# Patient Record
Sex: Male | Born: 1985 | Race: Black or African American | Hispanic: No | Marital: Single | Smoking: Current some day smoker
Health system: Southern US, Community
[De-identification: ages and names within clinical notes are randomized; demographics above are authoritative.]

## PROBLEM LIST (undated history)

## (undated) DIAGNOSIS — D573 Sickle-cell trait: Secondary | ICD-10-CM

## (undated) DIAGNOSIS — Z789 Other specified health status: Secondary | ICD-10-CM

## (undated) HISTORY — DX: Other specified health status: Z78.9

## (undated) HISTORY — DX: Sickle-cell trait: D57.3

---
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
## 2005-05-16 ENCOUNTER — Emergency Department (HOSPITAL_COMMUNITY): Admission: EM | Admit: 2005-05-16 | Discharge: 2005-05-16 | Payer: Self-pay | Admitting: Emergency Medicine

## 2013-09-19 ENCOUNTER — Emergency Department (HOSPITAL_COMMUNITY)
Admission: EM | Admit: 2013-09-19 | Discharge: 2013-09-19 | Disposition: A | Discharge status: Home / Self Care | Payer: Self-pay | Attending: Emergency Medicine | Admitting: Emergency Medicine

## 2013-09-19 ENCOUNTER — Encounter (HOSPITAL_COMMUNITY): Payer: Self-pay | Admitting: Emergency Medicine

## 2013-09-19 DIAGNOSIS — A638 Other specified predominantly sexually transmitted diseases: Secondary | ICD-10-CM | POA: Insufficient documentation

## 2013-09-19 DIAGNOSIS — R369 Urethral discharge, unspecified: Secondary | ICD-10-CM | POA: Insufficient documentation

## 2013-09-19 DIAGNOSIS — R300 Dysuria: Secondary | ICD-10-CM | POA: Insufficient documentation

## 2013-09-19 DIAGNOSIS — A64 Unspecified sexually transmitted disease: Secondary | ICD-10-CM

## 2013-09-19 LAB — URINALYSIS, ROUTINE W REFLEX MICROSCOPIC
Bilirubin Urine: NEGATIVE
Glucose, UA: NEGATIVE mg/dL
Hgb urine dipstick: NEGATIVE
Ketones, ur: NEGATIVE mg/dL
Leukocytes, UA: NEGATIVE
Nitrite: NEGATIVE
Protein, ur: NEGATIVE mg/dL
Specific Gravity, Urine: 1.021 (ref 1.005–1.030)
Urobilinogen, UA: 1 mg/dL (ref 0.0–1.0)
pH: 7 (ref 5.0–8.0)

## 2013-09-19 MED ORDER — AZITHROMYCIN 250 MG PO TABS
1000.0000 mg | ORAL_TABLET | Freq: Once | ORAL | Status: AC
  Administered 2013-09-19: 1000 mg via ORAL
  Filled 2013-09-19: qty 4

## 2013-09-19 MED ORDER — CEFTRIAXONE SODIUM 250 MG IJ SOLR
250.0000 mg | Freq: Once | INTRAMUSCULAR | Status: AC
  Administered 2013-09-19: 250 mg via INTRAMUSCULAR
  Filled 2013-09-19: qty 250

## 2013-09-19 MED ORDER — LIDOCAINE HCL (PF) 1 % IJ SOLN
INTRAMUSCULAR | Status: AC
  Filled 2013-09-19: qty 5

## 2013-09-19 NOTE — ED Provider Notes (Signed)
Medical screening examination/treatment/procedure(s) were performed by non-physician practitioner and as supervising physician I was immediately available for consultation/collaboration.   EKG Interpretation None      Shaye Lagace, MD, FACEP   Ryanna Teschner L Jerriah Ines, MD 09/19/13 1618 

## 2013-09-19 NOTE — ED Notes (Signed)
Pt reports dysuria and penile discharge since yesterday. He thinks he may have an std

## 2013-09-19 NOTE — Discharge Instructions (Signed)
Sexually Transmitted Disease A sexually transmitted disease (STD) is a disease or infection that may be passed (transmitted) from person to person, usually during sexual activity. This may happen by way of saliva, semen, blood, vaginal mucus, or urine. Common STDs include:   Gonorrhea.   Chlamydia.   Syphilis.   HIV and AIDS.   Genital herpes.   Hepatitis B and C.   Trichomonas.   Human papillomavirus (HPV).   Pubic lice.   Scabies.  Mites.  Bacterial vaginosis. WHAT ARE CAUSES OF STDs? An STD may be caused by bacteria, a virus, or parasites. STDs are often transmitted during sexual activity if one person is infected. However, they may also be transmitted through nonsexual means. STDs may be transmitted after:   Sexual intercourse with an infected person.   Sharing sex toys with an infected person.   Sharing needles with an infected person or using unclean piercing or tattoo needles.  Having intimate contact with the genitals, mouth, or rectal areas of an infected person.   Exposure to infected fluids during birth. WHAT ARE THE SIGNS AND SYMPTOMS OF STDs? Different STDs have different symptoms. Some people may not have any symptoms. If symptoms are present, they may include:   Painful or bloody urination.   Pain in the pelvis, abdomen, vagina, anus, throat, or eyes.   Skin rash, itching, irritation, growths, sores (lesions), ulcerations, or warts in the genital or anal area.  Abnormal vaginal discharge with or without bad odor.   Penile discharge in men.   Fever.   Pain or bleeding during sexual intercourse.   Swollen glands in the groin area.   Yellow skin and eyes (jaundice). This is seen with hepatitis.   Swollen testicles.  Infertility.  Sores and blisters in the mouth. HOW ARE STDs DIAGNOSED? To make a diagnosis, your health care provider may:   Take a medical history.   Perform a physical exam.   Take a sample of any  discharge for examination.  Swab the throat, cervix, opening to the penis, rectum, or vagina for testing.  Test a sample of your first morning urine.   Perform blood tests.   Perform a Pap smear, if this applies.   Perform a colposcopy.   Perform a laparoscopy.  HOW ARE STDs TREATED? Treatment depends on the STD. Some STDs may be treated but not cured.   Chlamydia, gonorrhea, trichomonas, and syphilis can be cured with antibiotics.   Genital herpes, hepatitis, and HIV can be treated, but not cured, with prescribed medicines. The medicines lessen symptoms.   Genital warts from HPV can be treated with medicine or by freezing, burning (electrocautery), or surgery. Warts may come back.   HPV cannot be cured with medicine or surgery. However, abnormal areas may be removed from the cervix, vagina, or vulva.   If your diagnosis is confirmed, your recent sexual partners need treatment. This is true even if they are symptom-free or have a negative culture or evaluation. They should not have sex until their health care providers say it is OK. HOW CAN I REDUCE MY RISK OF GETTING AN STD?  Use latex condoms, dental dams, and water-soluble lubricants during sexual activity. Do not use petroleum jelly or oils.  Get vaccinated for HPV and hepatitis. If you have not received these vaccines in the past, talk to your health care provider about whether one or both might be right for you.   Avoid risky sex practices that can break the skin.  WHAT SHOULD   I DO IF I THINK I HAVE AN STD?  See your health care provider.   Inform all sexual partners. They should be tested and treated for any STDs.  Do not have sex until your health care provider says it is OK. WHEN SHOULD I GET HELP? Seek immediate medical care if:  You develop severe abdominal pain.  You are a man and notice swelling or pain in the testicles.  You are a woman and notice swelling or pain in your vagina. Document  Released: 09/23/2002 Document Revised: 04/23/2013 Document Reviewed: 01/21/2013 [employer] Patient Information 2014 [employer].  

## 2013-09-19 NOTE — ED Provider Notes (Signed)
CSN: [id number]     Arrival date & time 09/19/13  1430 History  This chart was scribed for non-physician practitioner Roxy Horsemanobert Daltyn Degroat, PA-C working with Ward GivensIva L Knapp, MD by Valera CastleSteven Perry, ED scribe. This patient was seen in room TR09C/TR09C and the patient's care was started at 3:25 PM.   Chief Complaint  Patient presents with  . SEXUALLY TRANSMITTED DISEASE   (Consider location/radiation/quality/duration/timing/severity/associated sxs/prior Treatment) The history is provided by the patient. No language interpreter was used.   Lawrence Hammond is a 28 y.o. male who presents to the Emergency Department with chief complaint of penile discharge and dysuria, onset 2 days ago. He reports having intercourse with a new partner. He has not tried anything to relieve his symptoms. There are no aggravating or relieving factors. He denies fever, chills, nausea, vomiting, and any other associated symptoms.  PCP - No PCP Per Patient  History reviewed. No pertinent past medical history. History reviewed. No pertinent past surgical history. History reviewed. No pertinent family history. History  Substance Use Topics  . Smoking status: Never Smoker   . Smokeless tobacco: Not on file  . Alcohol Use: Yes    Review of Systems  Constitutional: Negative for fever.  Genitourinary: Positive for dysuria and discharge.   Allergies  Review of patient's allergies indicates no known allergies.  Home Medications  No current outpatient prescriptions on file.  BP 141/70  Pulse 90  Temp(Src) 98.6 F (37 C) (Oral)  Resp 16  Ht 6\' 2"  (1.88 m)  Wt 173 lb 1.6 oz (78.518 kg)  BMI 22.22 kg/m2  SpO2 100%  Physical Exam  Nursing note and vitals reviewed. Constitutional: He is oriented to person, place, and time. He appears well-developed and well-nourished. No distress.  HENT:  Head: Normocephalic and atraumatic.  Eyes: EOM are normal.  Neck: Neck supple.  Cardiovascular: Normal rate.   Pulmonary/Chest:  Effort normal. No respiratory distress.  Genitourinary: Testes normal. Circumcised. Discharge found.  Normal penis, testes, scrotum. Mild amount of clear penile discharge. No masses, lesions, or other abnormalities.   Musculoskeletal: Normal range of motion.  Neurological: He is alert and oriented to person, place, and time.  Skin: Skin is warm and dry.  Psychiatric: He has a normal mood and affect. His behavior is normal.    ED Course  Procedures (including critical care time)  DIAGNOSTIC STUDIES: Oxygen Saturation is 100% on room air, normal by my interpretation.    COORDINATION OF CARE: 3:28 PM-Discussed treatment plan with pt at bedside and pt agreed to plan.   Results for orders placed during the hospital encounter of 09/19/13  URINALYSIS, ROUTINE W REFLEX MICROSCOPIC      Result Value Ref Range   Color, Urine YELLOW  YELLOW   APPearance HAZY (*) CLEAR   Specific Gravity, Urine 1.021  1.005 - 1.030   pH 7.0  5.0 - 8.0   Glucose, UA NEGATIVE  NEGATIVE mg/dL   Hgb urine dipstick NEGATIVE  NEGATIVE   Bilirubin Urine NEGATIVE  NEGATIVE   Ketones, ur NEGATIVE  NEGATIVE mg/dL   Protein, ur NEGATIVE  NEGATIVE mg/dL   Urobilinogen, UA 1.0  0.0 - 1.0 mg/dL   Nitrite NEGATIVE  NEGATIVE   Leukocytes, UA NEGATIVE  NEGATIVE   No results found.   EKG Interpretation None     Medications - No data to display MDM   Final diagnoses:  STD (sexually transmitted disease)  Penile discharge    Patient with penile discharge. Treated in the emergency department  with Rocephin and azithromycin. STD instructions and precautions given.  I personally performed the services described in this documentation, which was scribed in my presence. The recorded information has been reviewed and is accurate.     Roxy Horseman, PA-C 09/19/13 1615

## 2013-09-21 LAB — GC/CHLAMYDIA PROBE AMP
CT Probe RNA: NEGATIVE
GC Probe RNA: NEGATIVE

## 2014-01-07 ENCOUNTER — Encounter (HOSPITAL_COMMUNITY): Payer: Self-pay | Admitting: Emergency Medicine

## 2014-01-07 ENCOUNTER — Emergency Department (HOSPITAL_COMMUNITY)
Admission: EM | Admit: 2014-01-07 | Discharge: 2014-01-07 | Disposition: A | Discharge status: Home / Self Care | Payer: Self-pay | Attending: Emergency Medicine | Admitting: Emergency Medicine

## 2014-01-07 DIAGNOSIS — Z8619 Personal history of other infectious and parasitic diseases: Secondary | ICD-10-CM | POA: Insufficient documentation

## 2014-01-07 DIAGNOSIS — R300 Dysuria: Secondary | ICD-10-CM

## 2014-01-07 DIAGNOSIS — R369 Urethral discharge, unspecified: Secondary | ICD-10-CM | POA: Insufficient documentation

## 2014-01-07 LAB — URINE MICROSCOPIC-ADD ON

## 2014-01-07 LAB — URINALYSIS, ROUTINE W REFLEX MICROSCOPIC
Bilirubin Urine: NEGATIVE
Glucose, UA: NEGATIVE mg/dL
Hgb urine dipstick: NEGATIVE
Ketones, ur: NEGATIVE mg/dL
Nitrite: NEGATIVE
Protein, ur: NEGATIVE mg/dL
Specific Gravity, Urine: 1.017 (ref 1.005–1.030)
Urobilinogen, UA: 1 mg/dL (ref 0.0–1.0)
pH: 7 (ref 5.0–8.0)

## 2014-01-07 MED ORDER — METRONIDAZOLE 500 MG PO TABS
2000.0000 mg | ORAL_TABLET | ORAL | Status: AC
  Administered 2014-01-07: 2000 mg via ORAL
  Filled 2014-01-07: qty 4

## 2014-01-07 MED ORDER — AZITHROMYCIN 250 MG PO TABS
1000.0000 mg | ORAL_TABLET | Freq: Once | ORAL | Status: AC
  Administered 2014-01-07: 1000 mg via ORAL
  Filled 2014-01-07: qty 4

## 2014-01-07 MED ORDER — CEFTRIAXONE SODIUM 250 MG IJ SOLR
250.0000 mg | Freq: Once | INTRAMUSCULAR | Status: AC
  Administered 2014-01-07: 250 mg via INTRAMUSCULAR
  Filled 2014-01-07: qty 250

## 2014-01-07 MED ORDER — LIDOCAINE HCL (PF) 1 % IJ SOLN
2.0000 mL | Freq: Once | INTRAMUSCULAR | Status: AC
  Administered 2014-01-07: 2 mL
  Filled 2014-01-07: qty 5

## 2014-01-07 NOTE — ED Notes (Signed)
Pt presents to department for evaluation of possible STD. Pt states burning with urination and penile discharge. Ongoing for several days. Pt is alert and oriented x4. NAD.

## 2014-01-07 NOTE — ED Provider Notes (Signed)
CSN: [id number]     Arrival date & time 01/07/14  1428 History   First MD Initiated Contact with Patient 01/07/14 1621     Chief Complaint  Patient presents with  . Dysuria  . Penile Discharge     (Consider location/radiation/quality/duration/timing/severity/associated sxs/prior Treatment) Patient is a 28 y.o. male presenting with dysuria and penile discharge. The history is provided by the patient.  Dysuria This is a new problem. The current episode started 6 to 12 hours ago. The problem occurs constantly. The problem has not changed since onset.Pertinent negatives include no chest pain, no abdominal pain, no headaches and no shortness of breath. Exacerbated by: urination. Nothing relieves the symptoms. He has tried nothing for the symptoms. The treatment provided no relief.  Penile Discharge This is a new problem. The current episode started 12 to 24 hours ago. The problem occurs constantly. The problem has not changed since onset.Pertinent negatives include no chest pain, no abdominal pain, no headaches and no shortness of breath. Nothing aggravates the symptoms. Nothing relieves the symptoms. He has tried nothing for the symptoms. The treatment provided no relief.    History reviewed. No pertinent past medical history. History reviewed. No pertinent past surgical history. History reviewed. No pertinent family history. History  Substance Use Topics  . Smoking status: Never Smoker   . Smokeless tobacco: Not on file  . Alcohol Use: Yes    Review of Systems  Constitutional: Negative for fever.  HENT: Negative for drooling and rhinorrhea.   Eyes: Negative for pain.  Respiratory: Negative for cough and shortness of breath.   Cardiovascular: Negative for chest pain and leg swelling.  Gastrointestinal: Negative for nausea, vomiting, abdominal pain and diarrhea.  Genitourinary: Positive for dysuria and discharge. Negative for hematuria.       Penile d/c  Musculoskeletal: Negative for  gait problem and neck pain.  Skin: Negative for color change.  Neurological: Negative for numbness and headaches.  Hematological: Negative for adenopathy.  Psychiatric/Behavioral: Negative for behavioral problems.  All other systems reviewed and are negative.     Allergies  Review of patient's allergies indicates no known allergies.  Home Medications   Prior to Admission medications   Not on File   BP 156/88  Pulse 90  Temp(Src) 98.5 F (36.9 C) (Oral)  Resp 18  Ht 6\' 4"  (1.93 m)  Wt 180 lb (81.647 kg)  BMI 21.92 kg/m2  SpO2 99% Physical Exam  Nursing note and vitals reviewed. Constitutional: He is oriented to person, place, and time. He appears well-developed and well-nourished.  HENT:  Head: Normocephalic and atraumatic.  Right Ear: External ear normal.  Left Ear: External ear normal.  Nose: Nose normal.  Mouth/Throat: Oropharynx is clear and moist. No oropharyngeal exudate.  Eyes: Conjunctivae and EOM are normal. Pupils are equal, round, and reactive to light.  Neck: Normal range of motion. Neck supple.  Cardiovascular: Normal rate, regular rhythm, normal heart sounds and intact distal pulses.  Exam reveals no gallop and no friction rub.   No murmur heard. Pulmonary/Chest: Effort normal and breath sounds normal. No respiratory distress. He has no wheezes.  Abdominal: Soft. Bowel sounds are normal. He exhibits no distension. There is no tenderness. There is no rebound and no guarding.  Musculoskeletal: Normal range of motion. He exhibits no edema and no tenderness.  Neurological: He is alert and oriented to person, place, and time.  Skin: Skin is warm and dry.  Psychiatric: He has a normal mood and affect. His behavior  is normal.    ED Course  Procedures (including critical care time) Labs Review Labs Reviewed  URINALYSIS, ROUTINE W REFLEX MICROSCOPIC - Abnormal; Notable for the following:    Leukocytes, UA SMALL (*)    All other components within normal limits   URINE MICROSCOPIC-ADD ON    Imaging Review No results found.   EKG Interpretation None      MDM   Final diagnoses:  Penile discharge  Dysuria    5:05 PM 28 y.o. male who presents with dysuria and whitish penile discharge which he first noticed this morning. He has a history of gonorrhea and has a new sexual partner. He states that he last had sex 2 days ago and did use protection. He is afebrile and vital signs are unremarkable here. He would like to defer STD testing and only wants to be treated as he has had similar symptoms in the past. He would also like to defer a genital exam. I recommended followup at the health department for routine syphilis and HIV testing. Will treat empirically for STDs and check a urinalysis.  6:08 PM: Pt empirically tx for STD's.  I have discussed the diagnosis/risks/treatment options with the patient and believe the pt to be eligible for discharge home to follow-up with the health dept as needed. We also discussed returning to the ED immediately if new or worsening sx occur. We discussed the sx which are most concerning (e.g., continued sx, fever, rash) that necessitate immediate return. Medications administered to the patient during their visit and any new prescriptions provided to the patient are listed below.  Medications given during this visit Medications  cefTRIAXone (ROCEPHIN) injection 250 mg (250 mg Intramuscular Given 01/07/14 1747)  metroNIDAZOLE (FLAGYL) tablet 2,000 mg (2,000 mg Oral Given 01/07/14 1744)  azithromycin (ZITHROMAX) tablet 1,000 mg (1,000 mg Oral Given 01/07/14 1744)  lidocaine (PF) (XYLOCAINE) 1 % injection 2 mL (2 mLs Other Given 01/07/14 1747)    New Prescriptions   No medications on file     Junius ArgyleForrest S Harrison, MD 01/08/14 [phone]

## 2014-12-08 ENCOUNTER — Emergency Department (HOSPITAL_COMMUNITY)
Admission: EM | Admit: 2014-12-08 | Discharge: 2014-12-08 | Disposition: A | Discharge status: Home / Self Care | Payer: BLUE CROSS/BLUE SHIELD | Attending: Emergency Medicine | Admitting: Emergency Medicine

## 2014-12-08 ENCOUNTER — Encounter (HOSPITAL_COMMUNITY): Payer: Self-pay | Admitting: Emergency Medicine

## 2014-12-08 DIAGNOSIS — R519 Headache, unspecified: Secondary | ICD-10-CM

## 2014-12-08 DIAGNOSIS — R51 Headache: Secondary | ICD-10-CM | POA: Diagnosis not present

## 2014-12-08 MED ORDER — PROCHLORPERAZINE MALEATE 5 MG PO TABS
5.0000 mg | ORAL_TABLET | Freq: Once | ORAL | Status: AC
  Administered 2014-12-08: 5 mg via ORAL
  Filled 2014-12-08: qty 1

## 2014-12-08 MED ORDER — KETOROLAC TROMETHAMINE 30 MG/ML IJ SOLN
30.0000 mg | Freq: Once | INTRAMUSCULAR | Status: DC
  Filled 2014-12-08: qty 1

## 2014-12-08 MED ORDER — DIPHENHYDRAMINE HCL 25 MG PO CAPS
25.0000 mg | ORAL_CAPSULE | Freq: Once | ORAL | Status: AC
  Administered 2014-12-08: 25 mg via ORAL
  Filled 2014-12-08: qty 1

## 2014-12-08 MED ORDER — KETOROLAC TROMETHAMINE 30 MG/ML IJ SOLN
30.0000 mg | Freq: Once | INTRAMUSCULAR | Status: AC
  Administered 2014-12-08: 30 mg via INTRAMUSCULAR

## 2014-12-08 NOTE — Discharge Instructions (Signed)
Please monitor for return of symptoms, please follow-up with any present. He may use ibuprofen or Tylenol at home for headaches. Please make an attempt to obtain adequate respiratory mask, FIT testing may be necessary for you to achieve adequate filtration.

## 2014-12-08 NOTE — ED Provider Notes (Signed)
CSN: [id number]     Arrival date & time 12/08/14  0810 History   First MD Initiated Contact with Patient 12/08/14 [phone]     Chief Complaint  Patient presents with  . Headache   HPI   29 year old male presents with headache today. 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 He reports that since beginning the job use experienced 3 bilateral temporal headaches, these associated with working around the environment, they do not present when he is at home. Patient denies any fever, neck stiffness, focal neurological deficits, reports that he is usually able to remove himself from the environment and use oral over-the-counter medication to help relieve the headaches. Patient reports that he was at work today when the headaches began. Patient has no other complaints in addition to the headache, describes it as temp oral, no radiation, denies fever, chills, head trauma, chest pain, abdominal pain, nausea, vomiting, lower extremity swelling or edema.   History reviewed. No pertinent past medical history. History reviewed. No pertinent past surgical history. History reviewed. No pertinent family history. History  Substance Use Topics  . Smoking status: Never Smoker   . Smokeless tobacco: Not on file  . Alcohol Use: Yes    Review of Systems  All other systems reviewed and are negative.   Allergies  Review of patient's allergies indicates no known allergies.  Home Medications   Prior to Admission medications   Not on File   BP 132/78 mmHg  Pulse 62  Temp(Src) 97.7 F (36.5 C)  Resp 16  Ht 6\' 2"  (1.88 m)  Wt 185 lb (83.915 kg)  BMI 23.74 kg/m2  SpO2 100%   Physical Exam  Constitutional: He is oriented to person, place, and time. He appears well-developed and well-nourished.  HENT:  Head: Normocephalic and atraumatic.  Eyes: Conjunctivae are normal. Pupils are equal, round, and reactive to light. Right  eye exhibits no discharge. Left eye exhibits no discharge. No scleral icterus.  Neck: Normal range of motion. No JVD present. No tracheal deviation present.  Cardiovascular: Normal rate, regular rhythm and normal heart sounds.   Pulmonary/Chest: Effort normal. No stridor.  Abdominal: Soft. There is no tenderness.  Musculoskeletal: Normal range of motion.  Neurological: He is alert and oriented to person, place, and time. He has normal strength. No cranial nerve deficit or sensory deficit. He displays a negative Romberg sign. Coordination and gait normal. GCS eye subscore is 4. GCS verbal subscore is 5. GCS motor subscore is 6.  Reflex Scores:      Patellar reflexes are 2+ on the right side and 2+ on the left side. Psychiatric: He has a normal mood and affect. His behavior is normal. Judgment and thought content normal.  Nursing note and vitals reviewed.   ED Course  Procedures (including critical care time) Labs Review Labs Reviewed - No data to display  Imaging Review No results found.   EKG Interpretation None      MDM   Final diagnoses:  Headache, unspecified headache type    Labs: None  Imaging: None  Consults: None  Therapeutics: Toradol, Compazine, Benadryl  Assessment: Headache  Plan: Patient presents with a headache. No red flags including fever, neck stiffness pain, focal neurological deficits, or any other concerning signs or symptoms. Patient states likely due to respiratory exposure from work. Patient was treated in the ED with complete symptom resolution, he was encouraged to follow-up for respiratory infiltration  system while working, and instructed to avoid exposure to aggravating stimulants. Patient was encouraged to follow up if new or worsening symptoms present, he verbalizes understanding and agreement today's plan he no further questions and concerns at time of discharge.      Eyvonne Mechanic, PA-C 12/08/14 1032  Purvis Sheffield, MD 12/08/14  [phone]
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
## 2015-03-26 ENCOUNTER — Emergency Department (HOSPITAL_COMMUNITY)
Admission: EM | Admit: 2015-03-26 | Discharge: 2015-03-26 | Disposition: A | Discharge status: Home / Self Care | Payer: BLUE CROSS/BLUE SHIELD | Attending: Emergency Medicine | Admitting: Emergency Medicine

## 2015-03-26 DIAGNOSIS — Y9389 Activity, other specified: Secondary | ICD-10-CM | POA: Insufficient documentation

## 2015-03-26 DIAGNOSIS — Y998 Other external cause status: Secondary | ICD-10-CM | POA: Diagnosis not present

## 2015-03-26 DIAGNOSIS — S3992XA Unspecified injury of lower back, initial encounter: Secondary | ICD-10-CM | POA: Diagnosis present

## 2015-03-26 DIAGNOSIS — Y92410 Unspecified street and highway as the place of occurrence of the external cause: Secondary | ICD-10-CM | POA: Diagnosis not present

## 2015-03-26 DIAGNOSIS — S39012A Strain of muscle, fascia and tendon of lower back, initial encounter: Secondary | ICD-10-CM | POA: Diagnosis not present

## 2015-03-26 MED ORDER — CYCLOBENZAPRINE HCL 5 MG PO TABS: 5.0000 mg | ORAL_TABLET | Freq: Three times a day (TID) | ORAL | Status: DC | PRN

## 2015-03-26 MED ORDER — IBUPROFEN 200 MG PO TABS
600.0000 mg | ORAL_TABLET | Freq: Once | ORAL | Status: AC
  Administered 2015-03-26: 600 mg via ORAL
  Filled 2015-03-26: qty 3

## 2015-03-26 MED ORDER — IBUPROFEN 600 MG PO TABS: 600.0000 mg | ORAL_TABLET | Freq: Four times a day (QID) | ORAL | Status: DC | PRN

## 2015-03-26 MED ORDER — CYCLOBENZAPRINE HCL 10 MG PO TABS
5.0000 mg | ORAL_TABLET | Freq: Once | ORAL | Status: AC
  Administered 2015-03-26: 5 mg via ORAL
  Filled 2015-03-26: qty 1
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
## 2015-03-29 ENCOUNTER — Encounter (HOSPITAL_COMMUNITY): Payer: Self-pay | Admitting: Family Medicine

## 2015-03-29 ENCOUNTER — Emergency Department (HOSPITAL_COMMUNITY)
Admission: EM | Admit: 2015-03-29 | Discharge: 2015-03-29 | Disposition: A | Discharge status: Home / Self Care | Payer: BLUE CROSS/BLUE SHIELD | Attending: Emergency Medicine | Admitting: Emergency Medicine

## 2015-03-29 DIAGNOSIS — M545 Low back pain, unspecified: Secondary | ICD-10-CM

## 2015-03-29 DIAGNOSIS — S161XXD Strain of muscle, fascia and tendon at neck level, subsequent encounter: Secondary | ICD-10-CM | POA: Diagnosis not present

## 2015-03-29 MED ORDER — NAPROXEN 250 MG PO TABS
500.0000 mg | ORAL_TABLET | Freq: Once | ORAL | Status: AC
  Administered 2015-03-29: 500 mg via ORAL
  Filled 2015-03-29: qty 2

## 2015-03-29 MED ORDER — METHOCARBAMOL 500 MG PO TABS
500.0000 mg | ORAL_TABLET | Freq: Once | ORAL | Status: AC
  Administered 2015-03-29: 500 mg via ORAL
  Filled 2015-03-29: qty 1

## 2015-03-29 MED ORDER — METHOCARBAMOL 500 MG PO TABS: 500.0000 mg | ORAL_TABLET | Freq: Two times a day (BID) | ORAL | Status: DC

## 2015-03-29 MED ORDER — NAPROXEN 500 MG PO TABS: 500.0000 mg | ORAL_TABLET | Freq: Two times a day (BID) | ORAL | Status: DC
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
## 2015-03-29 NOTE — Discharge Instructions (Signed)
1. Medications: robaxin, naproxyn, usual home medications °2. Treatment: rest, drink plenty of fluids, gentle stretching as discussed, alternate ice and heat °3. Follow Up: Please followup with your primary doctor in 3 days for discussion of your diagnoses and further evaluation after today's visit; if you do not have a primary care doctor use the resource guide provided to find one;  Return to the ER for worsening back pain, difficulty walking, loss of bowel or bladder control or other concerning symptoms ° ° °Back Exercises °Back exercises help treat and prevent back injuries. The goal of back exercises is to increase the strength of your abdominal and back muscles and the flexibility of your back. These exercises should be started when you no longer have back pain. Back exercises include: °· Pelvic Tilt. Lie on your back with your knees bent. Tilt your pelvis until the lower part of your back is against the floor. Hold this position 5 to 10 sec and repeat 5 to 10 times. °· Knee to Chest. Pull first 1 knee up against your chest and hold for 20 to 30 seconds, repeat this with the other knee, and then both knees. This may be done with the other leg straight or bent, whichever feels better. °· Sit-Ups or Curl-Ups. Bend your knees 90 degrees. Start with tilting your pelvis, and do a partial, slow sit-up, lifting your trunk only 30 to 45 degrees off the floor. Take at least 2 to 3 seconds for each sit-up. Do not do sit-ups with your knees out straight. If partial sit-ups are difficult, simply do the above but with only tightening your abdominal muscles and holding it as directed. °· Hip-Lift. Lie on your back with your knees flexed 90 degrees. Push down with your feet and shoulders as you raise your hips a couple inches off the floor; hold for 10 seconds, repeat 5 to 10 times. °· Back arches. Lie on your stomach, propping yourself up on bent elbows. Slowly press on your hands, causing an arch in your low back. Repeat 3  to 5 times. Any initial stiffness and discomfort should lessen with repetition over time. °· Shoulder-Lifts. Lie face down with arms beside your body. Keep hips and torso pressed to floor as you slowly lift your head and shoulders off the floor. °Do not overdo your exercises, especially in the beginning. Exercises may cause you some mild back discomfort which lasts for a few minutes; however, if the pain is more severe, or lasts for more than 15 minutes, do not continue exercises until you see your caregiver. Improvement with exercise therapy for back problems is slow.  °See your caregivers for assistance with developing a proper back exercise program. °Document Released: 08/10/2004 Document Revised: 09/25/2011 Document Reviewed: 05/04/2011 °ExitCare® Patient Information ©2015 [employer]. This information is not intended to replace advice given to you by your health care provider. Make sure you discuss any questions you have with your health care provider. ° ° ° °Emergency Department Resource Guide °1) Find a Doctor and Pay Out of Pocket °Although you won't have to find out who is covered by your insurance plan, it is a good idea to ask around and get recommendations. You will then need to call the office and see if the doctor you have chosen will accept you as a new patient and what types of options they offer for patients who are self-pay. Some doctors offer discounts or will set up payment plans for their patients who do not have insurance, but   you will need to ask so you aren't surprised when you get to your appointment. ° °2) Contact Your Local Health Department °Not all health departments have doctors that can see patients for sick visits, but many do, so it is worth a call to see if yours does. If you don't know where your local health department is, you can check in your phone book. The CDC also has a tool to help you locate your state's health department, and many state websites also have listings of all  of their local health departments. ° °3) Find a Walk-in Clinic °If your illness is not likely to be very severe or complicated, you may want to try a walk in clinic. These are popping up all over the country in pharmacies, drugstores, and shopping centers. They're usually staffed by nurse practitioners or physician assistants that have been trained to treat common illnesses and complaints. They're usually fairly quick and inexpensive. However, if you have serious medical issues or chronic medical problems, these are probably not your best option. ° °No Primary Care Doctor: °- Call [facility] at  832-8000 - they can help you locate a primary care doctor that  accepts your insurance, provides certain services, etc. °- Physician Referral Service- 1-800-533-3463 ° °Chronic Pain Problems: °Organization         Address  Phone   Notes  °[facility]  (336) 297-2271 Patients need to be referred by their primary care doctor.  ° °Medication Assistance: °Organization         Address  Phone   Notes  °[place] [facility] [address] °[place], [place] [address] (336) 641-8030 --Must be a resident of [place] °-- Must have NO insurance coverage whatsoever (no Medicaid/ Medicare, etc.) °-- The pt. MUST have a primary care doctor that directs their care regularly and follows them in the community °  °[facility]  (866) 331-1348   °[employer]  (888) 892-1162   ° °Agencies that provide inexpensive medical care: °Organization         Address  Phone   Notes  °[facility]  (336) 832-8035   °[facility]    (336) 832-7272   °[facility] [address] °[place], [place] [address] (336) 832-4777   °[facility] [address], °[place] (336) 271-4999   °[facility]    (336) 373-0678   °[facility]    (336) 272-1050   °[facility] ° [address],  [place] Phone:  (336) 832-4444, Fax:  (336) 832-4440 Hours of Operation:  9 am - 6 pm, M-F.  Also accepts Medicaid/Medicare and self-pay.  °[facility] ° [address], [place] Phone: (336) 832-3150, Fax: (336) 832-3151. Hours of Operation:  8:30 am - 5:30 pm, M-F.  Also accepts Medicaid and self-pay.  °[facility] [place] [address], [place] Phone: (336) 878-6027   °[facility] [address], [place], [place] (336)723-1848, Ext. 123 Mondays & Thursdays: 7-9 AM.  First 15 patients are seen on a first come, first serve basis. °  ° °Medicaid-accepting [place] Providers: ° °Organization         Address  Phone   Notes  °[facility] [address],  (336) 641-2100 Also accepts self-pay patients.  °[facility] [address], [place] ° (336) 856-9996   °[facility] [address], [place] (336) 288-8857   °[facility] [address] [place] [address], [place] (336) 299-7000   °Veita Bland [address], [place]  ° (336) 373-1557 Only accepts [place] Access Medicaid patients after they have their name applied to their card.  ° °Self-Pay (no insurance) in [place]: ° °Organization         Address  Phone   Notes  °Sickle Cell Patients, [facility] [address], [place] (336) 832-1970   °[facility] [address], [place] (336) 832-4400   °[facility] ° [address], [place] (336) 992-4800   °[facility]/Dr. Osei-Bonsu ° [address] [place] [address], [place] or [address], [place] (336) 841-8500 Phone number for both [place] and [place] locations is the same.  °[facility] [address], [place] (336) 299-0000   °[facility] [address] [place] [address], [place] or [address] (336) 852-7530 °(336) 878-2260   °[facility] [address], [place] (336) 350-1642, phone; (336) 294-5005, fax Sees patients 1st and 3rd Saturday of every month.  Must not qualify for public or private insurance (i.e. Medicaid, Medicare, [place] Health Choice, Veterans' Benefits) • Household income should be no more than 200% of the poverty level •The clinic cannot treat you if you are pregnant or think you are pregnant • Sexually transmitted diseases are not treated at the clinic.  ° ° °Dental Care: °Organization         Address  Phone  Notes  °[facility] [address], [place] (336) 641-6152 Accepts children up to age 21 who are enrolled in Medicaid or [place] Health Choice; pregnant women with a Medicaid card; and children who have applied for Medicaid or [place] Health Choice, but were declined, whose parents can pay a reduced fee at time of service.  °[facility] [place]  [address], [place] (336) 641-7733 Accepts children up to age 21 who are enrolled in Medicaid or [place] Health Choice; pregnant women with a Medicaid card; and children who have applied for Medicaid or [place] Health Choice, but were declined, whose parents can pay a reduced fee at time of service.  °[facility] ° [address], [place] (336) 641-4533 Patients are seen by appointment only. Walk-ins are not accepted. [facility] will see patients 18 years of age and older. °Monday - Tuesday (8am-5pm) °Most Wednesdays (8:30-5pm) °$30 per visit, cash only  °[facility] ° [address], [place] (336) 641-4533 Patients are seen by appointment only. Walk-ins are not accepted. [facility] will see patients 18 years of age and older. °One Wednesday Evening (Monthly: Volunteer Based).  $30 per visit, cash only  °[facility]   (919) 537-3737 for adults; Children under age 4, call Graduate Pediatric Dentistry at (919) [phone]. Children aged 4-14, please call (919) [phone] to request a pediatric application. ° Dental services are provided in all areas of dental care including fillings, crowns and bridges, complete and partial dentures, implants, gum treatment, root canals, and extractions. Preventive care is also provided. Treatment is provided to both adults and children. °Patients are selected via a lottery   and there is often a waiting list. °  °[facility] [address], °[place] ° (336) 763-8833 [email or web address] °  °[facility] [address], [place], [place] (336)723-1848, Ext. 123 Second and Fourth Thursday of each month, opens at 6:30 AM; Clinic ends at 9 AM.  Patients are seen on a first-come first-served basis, and a limited number are seen during each clinic.  ° °[facility] ° [address], [place], [place] (336) 723-7904   Eligibility Requirements °You must have lived in [place], [place], or [place] counties for at least the last three months. °  You cannot be eligible for state or federal sponsored healthcare insurance, including Veterans Administration, Medicaid, or Medicare. °  You generally cannot be eligible for healthcare insurance through your employer.  °  How to apply: °Eligibility screenings are held every Tuesday and Wednesday afternoon from 1:00 pm until 4:00 pm. You do not need an appointment for the interview!  °[facility] [address], [place], [place] 336-631-2330   °[facility]  336-342-8273   °[facility]  336-703-3100   °[facility]  336-570-6415   ° °Behavioral Health Resources in the Community: °Intensive Outpatient Programs °Organization         Address  Phone  Notes  °[facility] [address], [place], [place] 336-878-6098   °[facility] [address], [place], [place] 336-832-9800   °[facility] [address], [place], [place] ° 336-882-2125   °[facility] [address],  °[place], [place] 1-800-853-5163 or 336-641-4981   °Substance Abuse Resources °Organization         Address  Phone  Notes  °[facility]  336-882-2125   °[facility]  336-784-9470   °[facility]  336-285-9073   °[facility]  336-845-3988   °Residential & Outpatient Substance Abuse Program  1-800-659-3381   °Psychological Services °Organization         Address  Phone  Notes  ° Health  336- 832-9600   °[facility]  336- 378-7881   °[facility] [address], [place] 1-800-853-5163 or 336-641-4981   ° °Mobile Crisis Teams °Organization         Address  Phone  Notes  °[facility], Mobile Crisis Care Unit  1-877-626-1772   °Assertive °[facility] ° [address] [place], [place] 336-834-9664   °Sharon DeEsch [address] °[place] 336-554-5454   ° °Self-Help/Support Groups °Organization         Address  Phone             Notes  °[facility] - variety of support groups  336- 373-1402 Call for more information  °Narcotics Anonymous (NA), [facility] [address], °[place]  2 meetings at this location  ° °Residential Treatment Programs °Organization         Address  Phone  Notes  °[facility] [address],    ° [place]  1-866-801-8205   °[facility] ° [address], [place], [place] 704-293-8524   °[facility] [address], [place] 336-845-3988 Admissions: 8am-3pm M-F  °[facility] [address]    °[place], [place] 336-841-1104   °[facility] [address], , [place]   336-379-7146   °[facility] [address]  °[place], [place] 336-285-9073     °[facility] Intensive Outpatient [address], [place], [place] 336-852-3033   °[facility] [address]  °[place], [place] 1-877-615-2722 or 336-784-9470   °[facility] [address] [place], [place] 336-227-7417 Accepts Medicaid  °[facility] [address]  °[place] 1-800-659-3381 Substance Abuse/Addiction Treatment  ° °[place] Behavioral Health Resources °Organization         Address  Phone  Notes  °[facility]  (888) 581-9988   °Julie Brannon, PhD [address] [place], [place]   (336) 349-5553 or (336) 951-0000   °[facility]   [address] °[place], [place] (336) 349-4454   °[facility] [address], [place], [place] (336) 342-8316 Insurance/Medicaid/sponsorship through [facility]  °[facility] [address]                                    [place], [place] (336) 342-8316 Therapy/tele-psych/case  °[facility] [address]  ° [place], [place] (336) 349-2233    °Dr. Arfeen  (336) 349-4544   °[facility] [place]  [employer] [place] [facility] 1) [address], [place] °2) [address], [place] °3)  [address], [place] (336) 349-3220 °(336) 342-7768 ° °(336) 342-8140   °[place] [facility] (336) 342-1394 or (336) 342-3537 (After Hours)    ° ° ° ° ° °
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
## 2015-06-21 ENCOUNTER — Emergency Department (HOSPITAL_COMMUNITY)
Admission: EM | Admit: 2015-06-21 | Discharge: 2015-06-21 | Disposition: A | Discharge status: Home / Self Care | Payer: BLUE CROSS/BLUE SHIELD | Attending: Emergency Medicine | Admitting: Emergency Medicine

## 2015-06-21 ENCOUNTER — Encounter (HOSPITAL_COMMUNITY): Payer: Self-pay

## 2015-06-21 DIAGNOSIS — J069 Acute upper respiratory infection, unspecified: Secondary | ICD-10-CM | POA: Insufficient documentation

## 2015-06-21 DIAGNOSIS — R05 Cough: Secondary | ICD-10-CM | POA: Diagnosis present

## 2015-06-21 MED ORDER — PROMETHAZINE-DM 6.25-15 MG/5ML PO SYRP: 2.5000 mL | ORAL_SOLUTION | Freq: Four times a day (QID) | ORAL | Status: DC | PRN

## 2015-06-21 MED ORDER — FLUTICASONE PROPIONATE 50 MCG/ACT NA SUSP: 2.0000 | Freq: Every day | NASAL | Status: DC

## 2015-06-21 NOTE — ED Provider Notes (Signed)
CSN: [id number]     Arrival date & time 06/21/15  2146 History  By signing my name below, I, Gonzella Lex, attest that this documentation has been prepared under the direction and in the presence of Eyvonne Mechanic, PA-C Electronically Signed: Gonzella Lex, Scribe. 06/21/2015. 10:45 PM.   Chief Complaint  Patient presents with  . Cough    The history is provided by the patient. No language interpreter was used.    HPI Comments: Lawrence Hammond is a 29 y.o. male who presents to the Emergency Department complaining of a cough with associated rhinorrhea, sore throat, subjective fever, congestion, and runny eyes onset 2 days ago. 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 He reports that others at his job have been sick as well. He denies sick contact at home and h/o allergies. Pt has no other complaints at this time.  History reviewed. No pertinent past medical history. History reviewed. No pertinent past surgical history. No family history on file. Social History  Substance Use Topics  . Smoking status: Never Smoker   . Smokeless tobacco: None  . Alcohol Use: Yes    Review of Systems  All other systems reviewed and are negative.  Allergies  Review of patient's allergies indicates no known allergies.  Home Medications   Prior to Admission medications   Medication Sig Start Date End Date Taking? Authorizing Provider  fluticasone (FLONASE) 50 MCG/ACT nasal spray Place 2 sprays into both nostrils daily. 06/21/15   Eyvonne Mechanic, PA-C  methocarbamol (ROBAXIN) 500 MG tablet Take 1 tablet (500 mg total) by mouth 2 (two) times daily. 03/29/15   Hannah Muthersbaugh, PA-C  naproxen (NAPROSYN) 500 MG tablet Take 1 tablet (500 mg total) by mouth 2 (two) times daily with a meal. 03/29/15   Hannah Muthersbaugh, PA-C  promethazine-dextromethorphan (PROMETHAZINE-DM) 6.25-15 MG/5ML syrup Take 2.5 mLs by mouth 4 (four) times daily as needed for cough. 06/21/15   Amazin Pincock, PA-C   BP  140/74 mmHg  Pulse 88  Temp(Src) 98.6 F (37 C) (Oral)  Resp 16  SpO2 98%   Physical Exam  Constitutional: He is oriented to person, place, and time. He appears well-developed and well-nourished. No distress.  HENT:  Head: Normocephalic.  Right Ear: Tympanic membrane, external ear and ear canal normal.  Left Ear: Hearing, tympanic membrane, external ear and ear canal normal.  Nose: Rhinorrhea present.  Mouth/Throat: Uvula is midline and oropharynx is clear and moist. No oropharyngeal exudate, posterior oropharyngeal edema, posterior oropharyngeal erythema or tonsillar abscesses.  Eyes: Conjunctivae are normal.  Cardiovascular: Normal rate, regular rhythm, normal heart sounds and intact distal pulses.  Exam reveals no gallop and no friction rub.   No murmur heard. Pulmonary/Chest: Effort normal and breath sounds normal. No respiratory distress. He has no wheezes. He has no rales. He exhibits no tenderness.  Abdominal: He exhibits no distension.  Musculoskeletal: Normal range of motion. He exhibits no edema or tenderness.  Neurological: He is alert and oriented to person, place, and time.  Skin: Skin is warm and dry.  Psychiatric: He has a normal mood and affect.  Nursing note and vitals reviewed.   ED Course  Procedures  DIAGNOSTIC STUDIES:    Oxygen Saturation is 98% on RA, normal by my interpretation.   COORDINATION OF CARE:  10:13 PM Will prescribe Flonase and cough medication. Will write pt a work note for Tuesday and Wednesday. Advise pt to drink fluids and to stay away from sugary foods. Discussed treatment  plan with pt at bedside and pt agreed to plan.    MDM   Final diagnoses:  Viral upper respiratory infection    Labs:  Imaging:  Consults:  Therapeutics:  Discharge Meds:Flucticasone 50 MCG/ACT nasal spray; promethazine-dextromethorphan 6.25-15 MG/5ML syrup  Assessment/plan: 29 year old male presents with likely viral upper respiratory infection. Patient  has reassuring vital signs, clear lung sounds, no acute distress. Patient's most consistent with viral infection, he will be prescribed above medications, given strict return precautions. Patient verbalizes understanding and agreement for today's plan and had no further questions or concerns at time of discharge  I personally performed the services described in this documentation, which was scribed in my presence. The recorded information has been reviewed and is accurate.    Eyvonne MechanicJeffrey Deshay Blumenfeld, PA-C 06/21/15 [id number]  Pricilla LovelessScott Goldston, MD 06/22/15 0000

## 2015-06-21 NOTE — Discharge Instructions (Signed)
Upper Respiratory Infection, Adult Most upper respiratory infections (URIs) are a viral infection of the air passages leading to the lungs. A URI affects the nose, throat, and upper air passages. The most common type of URI is nasopharyngitis and is typically referred to as "the common cold." URIs run their course and usually go away on their own. Most of the time, a URI does not require medical attention, but sometimes a bacterial infection in the upper airways can follow a viral infection. This is called a secondary infection. Sinus and middle ear infections are common types of secondary upper respiratory infections. Bacterial pneumonia can also complicate a URI. A URI can worsen asthma and chronic obstructive pulmonary disease (COPD). Sometimes, these complications can require emergency medical care and may be life threatening.  CAUSES Almost all URIs are caused by viruses. A virus is a type of germ and can spread from one person to another.  RISKS FACTORS You may be at risk for a URI if:   You smoke.   You have chronic heart or lung disease.  You have a weakened defense (immune) system.   You are very young or very old.   You have nasal allergies or asthma.  You work in crowded or poorly ventilated areas.  You work in health care facilities or schools. SIGNS AND SYMPTOMS  Symptoms typically develop 2-3 days after you come in contact with a cold virus. Most viral URIs last 7-10 days. However, viral URIs from the influenza virus (flu virus) can last 14-18 days and are typically more severe. Symptoms may include:   Runny or stuffy (congested) nose.   Sneezing.   Cough.   Sore throat.   Headache.   Fatigue.   Fever.   Loss of appetite.   Pain in your forehead, behind your eyes, and over your cheekbones (sinus pain).  Muscle aches.  DIAGNOSIS  Your health care provider may diagnose a URI by:  Physical exam.  Tests to check that your symptoms are not due to  another condition such as:  Strep throat.  Sinusitis.  Pneumonia.  Asthma. TREATMENT  A URI goes away on its own with time. It cannot be cured with medicines, but medicines may be prescribed or recommended to relieve symptoms. Medicines may help:  Reduce your fever.  Reduce your cough.  Relieve nasal congestion. HOME CARE INSTRUCTIONS   Take medicines only as directed by your health care provider.   Gargle warm saltwater or take cough drops to comfort your throat as directed by your health care provider.  Use a warm mist humidifier or inhale steam from a shower to increase air moisture. This may make it easier to breathe.  Drink enough fluid to keep your urine clear or pale yellow.   Eat soups and other clear broths and maintain good nutrition.   Rest as needed.   Return to work when your temperature has returned to normal or as your health care provider advises. You may need to stay home longer to avoid infecting others. You can also use a face mask and careful hand washing to prevent spread of the virus.  Increase the usage of your inhaler if you have asthma.   Do not use any tobacco products, including cigarettes, chewing tobacco, or electronic cigarettes. If you need help quitting, ask your health care provider. PREVENTION  The best way to protect yourself from getting a cold is to practice good hygiene.   Avoid oral or hand contact with people with cold   symptoms.   Wash your hands often if contact occurs.  There is no clear evidence that vitamin C, vitamin E, echinacea, or exercise reduces the chance of developing a cold. However, it is always recommended to get plenty of rest, exercise, and practice good nutrition.  SEEK MEDICAL CARE IF:   You are getting worse rather than better.   Your symptoms are not controlled by medicine.   You have chills.  You have worsening shortness of breath.  You have brown or red mucus.  You have yellow or brown nasal  discharge.  You have pain in your face, especially when you bend forward.  You have a fever.  You have swollen neck glands.  You have pain while swallowing.  You have white areas in the back of your throat. SEEK IMMEDIATE MEDICAL CARE IF:   You have severe or persistent:  Headache.  Ear pain.  Sinus pain.  Chest pain.  You have chronic lung disease and any of the following:  Wheezing.  Prolonged cough.  Coughing up blood.  A change in your usual mucus.  You have a stiff neck.  You have changes in your:  Vision.  Hearing.  Thinking.  Mood. MAKE SURE YOU:   Understand these instructions.  Will watch your condition.  Will get help right away if you are not doing well or get worse.   This information is not intended to replace advice given to you by your health care provider. Make sure you discuss any questions you have with your health care provider.   Document Released: 12/27/2000 Document Revised: 11/17/2014 Document Reviewed: 10/08/2013 Elsevier Interactive Patient Education 2016 Elsevier Inc.  

## 2015-06-21 NOTE — ED Notes (Signed)
Pt states he has had a dry cough for two days, denies fevers or CP.  C/o nausea

## 2015-08-02 ENCOUNTER — Encounter (HOSPITAL_COMMUNITY): Payer: Self-pay | Admitting: Emergency Medicine

## 2015-08-02 ENCOUNTER — Emergency Department (HOSPITAL_COMMUNITY)
Admission: EM | Admit: 2015-08-02 | Discharge: 2015-08-03 | Disposition: A | Discharge status: Home / Self Care | Payer: BLUE CROSS/BLUE SHIELD | Attending: Emergency Medicine | Admitting: Emergency Medicine

## 2015-08-02 DIAGNOSIS — Z202 Contact with and (suspected) exposure to infections with a predominantly sexual mode of transmission: Secondary | ICD-10-CM

## 2015-08-02 DIAGNOSIS — R360 Urethral discharge without blood: Secondary | ICD-10-CM | POA: Diagnosis present

## 2015-08-02 LAB — URINALYSIS, ROUTINE W REFLEX MICROSCOPIC
Bilirubin Urine: NEGATIVE
Glucose, UA: NEGATIVE mg/dL
HGB URINE DIPSTICK: NEGATIVE
Nitrite: NEGATIVE
PROTEIN: NEGATIVE mg/dL
Specific Gravity, Urine: 1.02 (ref 1.005–1.030)
pH: 6 (ref 5.0–8.0)

## 2015-08-02 LAB — URINE MICROSCOPIC-ADD ON: RBC / HPF: NONE SEEN RBC/hpf (ref 0–5)

## 2015-08-02 MED ORDER — AZITHROMYCIN 250 MG PO TABS
1000.0000 mg | ORAL_TABLET | Freq: Once | ORAL | Status: AC
  Administered 2015-08-02: 1000 mg via ORAL
  Filled 2015-08-02: qty 4

## 2015-08-02 MED ORDER — CEFTRIAXONE SODIUM 250 MG IJ SOLR
250.0000 mg | Freq: Once | INTRAMUSCULAR | Status: AC
  Administered 2015-08-02: 250 mg via INTRAMUSCULAR
  Filled 2015-08-02: qty 250

## 2015-08-02 MED ORDER — LIDOCAINE HCL (PF) 1 % IJ SOLN
INTRAMUSCULAR | Status: AC
  Administered 2015-08-02: 2.1 mL
  Filled 2015-08-02: qty 5

## 2015-08-02 NOTE — Discharge Instructions (Signed)
Please refrain from sexual activity over the next 7 days. Your test results should be available within about 3 days. Cell phone from the flow manager's office will call you if any abnormalities noted. Safe Sex Safe sex is about reducing the risk of giving or getting a sexually transmitted disease (STD). STDs are spread through sexual contact involving the genitals, mouth, or rectum. Some STDs can be cured and others cannot. Safe sex can also prevent unintended pregnancies.  WHAT ARE SOME SAFE SEX PRACTICES?  Limit your sexual activity to only one partner who is having sex with only you.  Talk to your partner about his or her past partners, past STDs, and drug use.  Use a condom every time you have sexual intercourse. This includes vaginal, oral, and anal sexual activity. Both females and males should wear condoms during oral sex. Only use latex or polyurethane condoms and water-based lubricants. Using petroleum-based lubricants or oils to lubricate a condom will weaken the condom and increase the chance that it will break. The condom should be in place from the beginning to the end of sexual activity. Wearing a condom reduces, but does not completely eliminate, your risk of getting or giving an STD. STDs can be spread by contact with infected body fluids and skin.  Get vaccinated for hepatitis B and HPV.  Avoid alcohol and recreational drugs, which can affect your judgment. You may forget to use a condom or participate in high-risk sex.  For females, avoid douching after sexual intercourse. Douching can spread an infection farther into the reproductive tract.  Check your body for signs of sores, blisters, rashes, or unusual discharge. See your health care provider if you notice any of these signs.  Avoid sexual contact if you have symptoms of an infection or are being treated for an STD. If you or your partner has herpes, avoid sexual contact when blisters are present. Use condoms at all other  times.  If you are at risk of being infected with HIV, it is recommended that you take a prescription medicine daily to prevent HIV infection. This is called pre-exposure prophylaxis (PrEP). You are considered at risk if:  You are a man who has sex with other men (MSM).  You are a heterosexual man or woman who is sexually active with more than one partner.  You take drugs by injection.  You are sexually active with a partner who has HIV.  Talk with your health care provider about whether you are at high risk of being infected with HIV. If you choose to begin PrEP, you should first be tested for HIV. You should then be tested every 3 months for as long as you are taking PrEP.  See your health care provider for regular screenings, exams, and tests for other STDs. Before having sex with a new partner, each of you should be screened for STDs and should talk about the results with each other. WHAT ARE THE BENEFITS OF SAFE SEX?   There is less chance of getting or giving an STD.  You can prevent unwanted or unintended pregnancies.  By discussing safe sex concerns with your partner, you may increase feelings of intimacy, comfort, trust, and honesty between the two of you.   This information is not intended to replace advice given to you by your health care provider. Make sure you discuss any questions you have with your health care provider.   Document Released: 08/10/2004 Document Revised: 07/24/2014 Document Reviewed: 12/25/2011 Elsevier Interactive Patient Education  2016 Elsevier Inc. ° °

## 2015-08-02 NOTE — ED Provider Notes (Signed)
CSN: [id number]     Arrival date & time 08/02/15  2006 History   First MD Initiated Contact with Patient 08/02/15 2141     Chief Complaint  Patient presents with  . SEXUALLY TRANSMITTED DISEASE     (Consider location/radiation/quality/duration/timing/severity/associated sxs/prior Treatment) Patient is a 30 y.o. male presenting with penile discharge. The history is provided by the patient.  Penile Discharge This is a new problem. The current episode started in the past 7 days. The problem occurs intermittently. The problem has been gradually worsening. Associated symptoms include urinary symptoms. Pertinent negatives include no chills, fever, nausea, rash or vomiting. Nothing aggravates the symptoms. He has tried nothing for the symptoms. The treatment provided no relief.    History reviewed. No pertinent past medical history. History reviewed. No pertinent past surgical history. History reviewed. No pertinent family history. Social History  Substance Use Topics  . Smoking status: Never Smoker   . Smokeless tobacco: None  . Alcohol Use: Yes     Comment: "sometimes"    Review of Systems  Constitutional: Negative for fever and chills.  Gastrointestinal: Negative for nausea and vomiting.  Genitourinary: Positive for discharge. Negative for penile swelling, scrotal swelling and testicular pain.  Skin: Negative for rash.  All other systems reviewed and are negative.     Allergies  Review of patient's allergies indicates no known allergies.  Home Medications   Prior to Admission medications   Not on File   BP 159/87 mmHg  Pulse 68  Temp(Src) 98.2 F (36.8 C) (Oral)  Resp 16  Ht 6\' 4"  (1.93 m)  Wt 86.183 kg  BMI 23.14 kg/m2  SpO2 96% Physical Exam  Constitutional: He is oriented to person, place, and time. He appears well-developed and well-nourished.  Non-toxic appearance.  HENT:  Head: Normocephalic.  Right Ear: Tympanic membrane and external ear normal.  Left Ear:  Tympanic membrane and external ear normal.  Eyes: EOM and lids are normal. Pupils are equal, round, and reactive to light.  Neck: Normal range of motion. Neck supple. Carotid bruit is not present.  Cardiovascular: Normal rate, regular rhythm, normal heart sounds, intact distal pulses and normal pulses.   Pulmonary/Chest: Breath sounds normal. No respiratory distress.  Abdominal: Soft. Bowel sounds are normal. There is no tenderness. There is no guarding. Hernia confirmed negative in the right inguinal area and confirmed negative in the left inguinal area.  Genitourinary: Testes normal. Right testis shows no swelling and no tenderness. Left testis shows no swelling and no tenderness. Circumcised. No penile tenderness.  Small amount of clear drainage for the tip of the penis.  Musculoskeletal: Normal range of motion.  Lymphadenopathy:       Head (right side): No submandibular adenopathy present.       Head (left side): No submandibular adenopathy present.    He has no cervical adenopathy.  Neurological: He is alert and oriented to person, place, and time. He has normal strength. No cranial nerve deficit or sensory deficit.  Skin: Skin is warm and dry.  Psychiatric: He has a normal mood and affect. His speech is normal.  Nursing note and vitals reviewed.   ED Course  Procedures (including critical care time) Labs Review Labs Reviewed  RPR  HIV ANTIBODY (ROUTINE TESTING)  GC/CHLAMYDIA PROBE AMP ([place]) NOT AT [facility]    Imaging Review No results found. I have personally reviewed and evaluated these images and lab results as part of my medical decision-making.   EKG Interpretation None  MDM  Vital signs are well within normal limits. Patient states that his significant other was told she had a bacterial infection. He had sex with her less than a week ago. He has now been having stinging for file for 5 days. He's also had a clear type discharge from the penis. No other rash  reported. No high fevers noted.  The patient will be treated with intramuscular Rocephin and oral Zithromax. Urethral cultures have been sent to the lab. Urine culture sent to the lab.  Pt advised to refrain from sexual activity for the next 7 days. Discussed safer sex.   Final diagnoses:  STD exposure    **I have reviewed nursing notes, vital signs, and all appropriate lab and imaging results for this patient.Ivery Quale*    Armarion Greek, PA-C 08/02/15 2317  Eber HongBrian Miller, MD 08/04/15 1026

## 2015-08-02 NOTE — ED Notes (Signed)
Patient complaining of discharge from penis and "stinging" to area x 4-5 days. States he has had unprotected sex recently.

## 2015-08-03 NOTE — ED Notes (Signed)
Pt discharged by [facility]

## 2015-08-04 LAB — URINE CULTURE
Culture: 3000
SPECIAL REQUESTS: NORMAL

## 2015-08-04 LAB — HIV ANTIBODY (ROUTINE TESTING W REFLEX): HIV Screen 4th Generation wRfx: NONREACTIVE

## 2015-08-04 LAB — RPR: RPR Ser Ql: NONREACTIVE

## 2015-08-04 LAB — GC/CHLAMYDIA PROBE AMP (~~LOC~~) NOT AT ARMC
Chlamydia: NEGATIVE
Neisseria Gonorrhea: NEGATIVE

## 2015-12-04 ENCOUNTER — Emergency Department (HOSPITAL_COMMUNITY)
Admission: EM | Admit: 2015-12-04 | Discharge: 2015-12-04 | Disposition: A | Discharge status: Home / Self Care | Payer: BLUE CROSS/BLUE SHIELD | Attending: Emergency Medicine | Admitting: Emergency Medicine

## 2015-12-04 ENCOUNTER — Emergency Department (HOSPITAL_COMMUNITY): Payer: BLUE CROSS/BLUE SHIELD

## 2015-12-04 ENCOUNTER — Encounter (HOSPITAL_COMMUNITY): Payer: Self-pay

## 2015-12-04 DIAGNOSIS — Y999 Unspecified external cause status: Secondary | ICD-10-CM | POA: Insufficient documentation

## 2015-12-04 DIAGNOSIS — S161XXA Strain of muscle, fascia and tendon at neck level, initial encounter: Secondary | ICD-10-CM | POA: Insufficient documentation

## 2015-12-04 DIAGNOSIS — Y92410 Unspecified street and highway as the place of occurrence of the external cause: Secondary | ICD-10-CM | POA: Insufficient documentation

## 2015-12-04 DIAGNOSIS — Y939 Activity, unspecified: Secondary | ICD-10-CM | POA: Insufficient documentation

## 2015-12-04 DIAGNOSIS — S199XXA Unspecified injury of neck, initial encounter: Secondary | ICD-10-CM | POA: Diagnosis present

## 2015-12-04 DIAGNOSIS — M25512 Pain in left shoulder: Secondary | ICD-10-CM | POA: Diagnosis not present

## 2015-12-04 MED ORDER — HYDROCODONE-ACETAMINOPHEN 5-325 MG PO TABS
1.0000 | ORAL_TABLET | Freq: Once | ORAL | Status: AC
  Administered 2015-12-04: 1 via ORAL
  Filled 2015-12-04: qty 1

## 2015-12-04 MED ORDER — HYDROCODONE-ACETAMINOPHEN 5-325 MG PO TABS: 1.0000 | ORAL_TABLET | ORAL | Status: DC | PRN

## 2015-12-04 MED ORDER — IBUPROFEN 600 MG PO TABS: 600.0000 mg | ORAL_TABLET | Freq: Four times a day (QID) | ORAL | Status: DC | PRN
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
## 2015-12-04 NOTE — ED Notes (Signed)
Patient verbalizes understanding of discharge instructions, prescriptions, home care and follow up care. Patient out of department at this time. 

## 2015-12-07 ENCOUNTER — Emergency Department (HOSPITAL_COMMUNITY)
Admission: EM | Admit: 2015-12-07 | Discharge: 2015-12-07 | Disposition: A | Discharge status: Home / Self Care | Payer: BLUE CROSS/BLUE SHIELD | Attending: Emergency Medicine | Admitting: Emergency Medicine

## 2015-12-07 ENCOUNTER — Encounter (HOSPITAL_COMMUNITY): Payer: Self-pay | Admitting: *Deleted

## 2015-12-07 DIAGNOSIS — Y92410 Unspecified street and highway as the place of occurrence of the external cause: Secondary | ICD-10-CM | POA: Insufficient documentation

## 2015-12-07 DIAGNOSIS — S46912D Strain of unspecified muscle, fascia and tendon at shoulder and upper arm level, left arm, subsequent encounter: Secondary | ICD-10-CM | POA: Diagnosis not present

## 2015-12-07 DIAGNOSIS — M25512 Pain in left shoulder: Secondary | ICD-10-CM | POA: Diagnosis present

## 2015-12-07 DIAGNOSIS — Y999 Unspecified external cause status: Secondary | ICD-10-CM | POA: Diagnosis not present

## 2015-12-07 DIAGNOSIS — Y939 Activity, unspecified: Secondary | ICD-10-CM | POA: Diagnosis not present

## 2015-12-07 MED ORDER — CYCLOBENZAPRINE HCL 10 MG PO TABS: 10.0000 mg | ORAL_TABLET | Freq: Three times a day (TID) | ORAL | Status: DC

## 2015-12-07 MED ORDER — CYCLOBENZAPRINE HCL 10 MG PO TABS
10.0000 mg | ORAL_TABLET | Freq: Once | ORAL | Status: AC
  Administered 2015-12-07: 10 mg via ORAL
  Filled 2015-12-07: qty 1

## 2015-12-07 MED ORDER — DICLOFENAC SODIUM 75 MG PO TBEC: 75.0000 mg | DELAYED_RELEASE_TABLET | Freq: Two times a day (BID) | ORAL | Status: DC

## 2015-12-07 MED ORDER — DEXAMETHASONE 4 MG PO TABS: 4.0000 mg | ORAL_TABLET | Freq: Two times a day (BID) | ORAL | Status: DC

## 2015-12-07 MED ORDER — KETOROLAC TROMETHAMINE 10 MG PO TABS
10.0000 mg | ORAL_TABLET | Freq: Once | ORAL | Status: AC
Start: 2015-12-07 — End: 2015-12-07
  Administered 2015-12-07: 10 mg via ORAL
  Filled 2015-12-07: qty 1
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
## 2015-12-07 NOTE — ED Notes (Signed)
Pt instructed on use, care, and application of sling at this time. Pt verbalized understanding and returned demonstration of application. No further questions at this time.

## 2015-12-07 NOTE — Discharge Instructions (Signed)
Please see Dr. Romeo AppleHarrison for orthopedic evaluation concerning your left shoulder as soon as possible. Please use your sling until evaluated by the orthopedic specialist. Use Decadron and diclofenac daily with food. Flexeril-This medication may cause drowsiness. Please do not drink, drive, or participate in activity that requires concentration while taking this medication.
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
## 2016-04-19 ENCOUNTER — Emergency Department (HOSPITAL_COMMUNITY)
Admission: EM | Admit: 2016-04-19 | Discharge: 2016-04-19 | Disposition: A | Discharge status: Home / Self Care | Payer: BLUE CROSS/BLUE SHIELD | Attending: Emergency Medicine | Admitting: Emergency Medicine

## 2016-04-19 ENCOUNTER — Encounter (HOSPITAL_COMMUNITY): Payer: Self-pay | Admitting: Emergency Medicine

## 2016-04-19 ENCOUNTER — Emergency Department (HOSPITAL_COMMUNITY): Payer: BLUE CROSS/BLUE SHIELD

## 2016-04-19 DIAGNOSIS — Y929 Unspecified place or not applicable: Secondary | ICD-10-CM | POA: Diagnosis not present

## 2016-04-19 DIAGNOSIS — R51 Headache: Secondary | ICD-10-CM | POA: Diagnosis not present

## 2016-04-19 DIAGNOSIS — S01411A Laceration without foreign body of right cheek and temporomandibular area, initial encounter: Secondary | ICD-10-CM | POA: Insufficient documentation

## 2016-04-19 DIAGNOSIS — Y999 Unspecified external cause status: Secondary | ICD-10-CM | POA: Insufficient documentation

## 2016-04-19 DIAGNOSIS — Y939 Activity, unspecified: Secondary | ICD-10-CM | POA: Insufficient documentation

## 2016-04-19 DIAGNOSIS — S0181XA Laceration without foreign body of other part of head, initial encounter: Secondary | ICD-10-CM

## 2016-04-19 MED ORDER — IBUPROFEN 400 MG PO TABS
600.0000 mg | ORAL_TABLET | Freq: Once | ORAL | Status: AC
  Administered 2016-04-19: 600 mg via ORAL
  Filled 2016-04-19: qty 2

## 2016-04-19 MED ORDER — LIDOCAINE HCL (PF) 1 % IJ SOLN
5.0000 mL | Freq: Once | INTRAMUSCULAR | Status: AC
  Administered 2016-04-19: 5 mL
  Filled 2016-04-19: qty 5

## 2016-04-19 MED ORDER — BACITRACIN-NEOMYCIN-POLYMYXIN 400-5-5000 EX OINT
TOPICAL_OINTMENT | Freq: Once | CUTANEOUS | Status: AC
  Administered 2016-04-19: 1 via TOPICAL
  Filled 2016-04-19: qty 1
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
## 2016-04-19 NOTE — ED Notes (Signed)
MD at bedside. 

## 2016-04-19 NOTE — ED Notes (Signed)
Wound irrigated with 10 ml NS. Pt tolerated well.

## 2016-06-14 ENCOUNTER — Emergency Department (HOSPITAL_COMMUNITY)
Admission: EM | Admit: 2016-06-14 | Discharge: 2016-06-14 | Disposition: A | Discharge status: Home / Self Care | Payer: BLUE CROSS/BLUE SHIELD | Attending: Emergency Medicine | Admitting: Emergency Medicine

## 2016-06-14 ENCOUNTER — Emergency Department (HOSPITAL_COMMUNITY): Payer: BLUE CROSS/BLUE SHIELD

## 2016-06-14 ENCOUNTER — Encounter (HOSPITAL_COMMUNITY): Payer: Self-pay | Admitting: *Deleted

## 2016-06-14 DIAGNOSIS — R05 Cough: Secondary | ICD-10-CM | POA: Diagnosis not present

## 2016-06-14 DIAGNOSIS — R0602 Shortness of breath: Secondary | ICD-10-CM | POA: Diagnosis not present

## 2016-06-14 DIAGNOSIS — R062 Wheezing: Secondary | ICD-10-CM | POA: Diagnosis not present

## 2016-06-14 DIAGNOSIS — R509 Fever, unspecified: Secondary | ICD-10-CM | POA: Diagnosis not present

## 2016-06-14 DIAGNOSIS — R0789 Other chest pain: Secondary | ICD-10-CM | POA: Diagnosis not present

## 2016-06-14 DIAGNOSIS — J3489 Other specified disorders of nose and nasal sinuses: Secondary | ICD-10-CM | POA: Diagnosis not present

## 2016-06-14 DIAGNOSIS — R059 Cough, unspecified: Secondary | ICD-10-CM

## 2016-06-14 MED ORDER — BENZONATATE 100 MG PO CAPS
200.0000 mg | ORAL_CAPSULE | Freq: Once | ORAL | Status: AC
  Administered 2016-06-14: 200 mg via ORAL
  Filled 2016-06-14: qty 2

## 2016-06-14 MED ORDER — ALBUTEROL SULFATE HFA 108 (90 BASE) MCG/ACT IN AERS
2.0000 | INHALATION_SPRAY | Freq: Once | RESPIRATORY_TRACT | Status: AC
  Administered 2016-06-14: 2 via RESPIRATORY_TRACT
  Filled 2016-06-14: qty 6.7

## 2016-06-14 MED ORDER — BENZONATATE 200 MG PO CAPS: 200.0000 mg | ORAL_CAPSULE | Freq: Three times a day (TID) | ORAL | 0 refills | Status: DC | PRN

## 2016-06-14 NOTE — Discharge Instructions (Signed)
Continue using the inhaler given - 2 puffs every 4 hours if needed for coughing or wheezing.  Use the tessalon also for cough suppression.  Your chest xray is clear today.

## 2016-06-14 NOTE — ED Provider Notes (Signed)
[facility] DEPT Provider Note   CSN: [id number] Arrival date & time: 06/14/16  1439     History   Chief Complaint Chief Complaint  Patient presents with  . Cough    HPI Lawrence Hammond is a 30 y.o. male presenting with a 3 week history of persistent nonproductive cough along with nasal drainage of clear to white discharge and possibly some mild post nasal drip.  He endorses intermittent episodes of shortness of breath, worsened with coughing, and describes his ability to exhale at times is reduced. He denies any recognized overt wheezing however.  He has had no fevers, he does endorse midsternal chet pain but only with a deep cough.  He has tried nyquill and robitussin without relief.  He is not a smoker.  The history is provided by the patient.    History reviewed. No pertinent past medical history.  There are no active problems to display for this patient.   History reviewed. No pertinent surgical history.     Home Medications    Prior to Admission medications   Medication Sig Start Date End Date Taking? Authorizing Provider  benzonatate (TESSALON) 200 MG capsule Take 1 capsule (200 mg total) by mouth 3 (three) times daily as needed for cough. 06/14/16   Burgess AmorJulie Shyvonne Chastang, PA-C    Family History No family history on file.  Social History Social History  Substance Use Topics  . Smoking status: Never Smoker  . Smokeless tobacco: Never Used  . Alcohol use Yes     Comment: "sometimes"     Allergies   Patient has no known allergies.   Review of Systems Review of Systems  Constitutional: Positive for chills and fever.  HENT: Positive for postnasal drip and rhinorrhea. Negative for congestion, ear pain, sinus pressure, sore throat, trouble swallowing and voice change.   Eyes: Negative for discharge.  Respiratory: Positive for cough, chest tightness and shortness of breath. Negative for wheezing and stridor.   Cardiovascular: Positive for chest pain. Negative for  palpitations and leg swelling.  Gastrointestinal: Negative for abdominal pain.  Genitourinary: Negative.      Physical Exam Updated Vital Signs BP 145/88 (BP Location: Left Arm)   Pulse 74   Temp 98.3 F (36.8 C) (Oral)   Resp 18   Ht 6\' 2"  (1.88 m)   Wt 81.6 kg   SpO2 97%   BMI 23.11 kg/m   Physical Exam  Constitutional: He is oriented to person, place, and time. He appears well-developed and well-nourished.  HENT:  Head: Normocephalic and atraumatic.  Right Ear: Tympanic membrane and ear canal normal.  Left Ear: Tympanic membrane and ear canal normal.  Nose: Mucosal edema and rhinorrhea present.  Mouth/Throat: Uvula is midline, oropharynx is clear and moist and mucous membranes are normal. No oropharyngeal exudate, posterior oropharyngeal edema, posterior oropharyngeal erythema or tonsillar abscesses.  Eyes: Conjunctivae are normal.  Cardiovascular: Normal rate and normal heart sounds.   Pulmonary/Chest: Effort normal. No respiratory distress. He has no wheezes. He has no rales. He exhibits no tenderness.  Abdominal: Soft. There is no tenderness.  Musculoskeletal: Normal range of motion. He exhibits no edema.  Neurological: He is alert and oriented to person, place, and time.  Skin: Skin is warm and dry. No rash noted.  Psychiatric: He has a normal mood and affect.     ED Treatments / Results  Labs (all labs ordered are listed, but only abnormal results are displayed) Labs Reviewed - No data to display  EKG  EKG Interpretation None       Radiology Dg Chest 2 View  Result Date: 06/14/2016 CLINICAL DATA:  Nonproductive cough for the past 3 weeks. Originally the cough was accompanied by sore throat and nasal congestion but these symptoms have resolved. EXAM: CHEST  2 VIEW COMPARISON:  None in PACs FINDINGS: The lungs are well-expanded and clear. The heart and pulmonary vascularity are normal. The mediastinum is normal in width. There is no pleural effusion. The  bony thorax exhibits no acute abnormality. IMPRESSION: There is no active cardiopulmonary disease. Electronically Signed   By: David  SwazilandJordan M.D.   On: 06/14/2016 16:48    Procedures Procedures (including critical care time)  Medications Ordered in ED Medications  albuterol (PROVENTIL HFA;VENTOLIN HFA) 108 (90 Base) MCG/ACT inhaler 2 puff (2 puffs Inhalation Given 06/14/16 1657)  benzonatate (TESSALON) capsule 200 mg (200 mg Oral Given 06/14/16 1626)     Initial Impression / Assessment and Plan / ED Course  I have reviewed the triage vital signs and the nursing notes.  Pertinent labs & imaging results that were available during my care of the patient were reviewed by me and considered in my medical decision making (see chart for details).  Clinical Course     Pt with sx suggesting intermittent wheezing with persistent cough, no wheezing at present.  He was prescribed tessalon and given an albuterol mdi.  Encouraged cough lozenges, honey, advised claritin otc for nasal drainage.  The patient appears reasonably screened and/or stabilized for discharge and I doubt any other medical condition or other [facility] requiring further screening, evaluation, or treatment in the ED at this time prior to discharge.   Final Clinical Impressions(s) / ED Diagnoses   Final diagnoses:  Cough    New Prescriptions Discharge Medication List as of 06/14/2016  5:27 PM    START taking these medications   Details  benzonatate (TESSALON) 200 MG capsule Take 1 capsule (200 mg total) by mouth 3 (three) times daily as needed for cough., Starting Wed 06/14/2016, Print         Burgess AmorJulie Heith Haigler, PA-C 06/14/16 1812    Raeford RazorStephen Kohut, MD 06/20/16 1009

## 2016-06-14 NOTE — ED Triage Notes (Signed)
Pt c/o non productive cough x 3 weeks. Denies fever. Pt reports nasal congestion and sore throat when the cough started, but those are both now gone.

## 2016-08-14 ENCOUNTER — Emergency Department (HOSPITAL_COMMUNITY)
Admission: EM | Admit: 2016-08-14 | Discharge: 2016-08-14 | Disposition: A | Discharge status: Home / Self Care | Payer: BLUE CROSS/BLUE SHIELD | Attending: Emergency Medicine | Admitting: Emergency Medicine

## 2016-08-14 ENCOUNTER — Encounter (HOSPITAL_COMMUNITY): Payer: Self-pay

## 2016-08-14 DIAGNOSIS — J111 Influenza due to unidentified influenza virus with other respiratory manifestations: Secondary | ICD-10-CM

## 2016-08-14 DIAGNOSIS — R05 Cough: Secondary | ICD-10-CM | POA: Diagnosis not present

## 2016-08-14 DIAGNOSIS — R51 Headache: Secondary | ICD-10-CM | POA: Insufficient documentation

## 2016-08-14 DIAGNOSIS — R110 Nausea: Secondary | ICD-10-CM | POA: Diagnosis not present

## 2016-08-14 DIAGNOSIS — M791 Myalgia: Secondary | ICD-10-CM | POA: Diagnosis not present

## 2016-08-14 DIAGNOSIS — R509 Fever, unspecified: Secondary | ICD-10-CM | POA: Diagnosis not present

## 2016-08-14 DIAGNOSIS — R69 Illness, unspecified: Secondary | ICD-10-CM

## 2016-08-14 MED ORDER — PROMETHAZINE HCL 25 MG PO TABS: 25.0000 mg | ORAL_TABLET | Freq: Four times a day (QID) | ORAL | 0 refills | Status: DC | PRN

## 2016-08-14 MED ORDER — BENZONATATE 100 MG PO CAPS
200.0000 mg | ORAL_CAPSULE | Freq: Once | ORAL | Status: AC
  Administered 2016-08-14: 200 mg via ORAL
  Filled 2016-08-14: qty 2

## 2016-08-14 MED ORDER — PROMETHAZINE HCL 12.5 MG PO TABS
25.0000 mg | ORAL_TABLET | Freq: Once | ORAL | Status: AC
  Administered 2016-08-14: 25 mg via ORAL
  Filled 2016-08-14: qty 2

## 2016-08-14 MED ORDER — BENZONATATE 100 MG PO CAPS: 200.0000 mg | ORAL_CAPSULE | Freq: Three times a day (TID) | ORAL | 0 refills | Status: DC | PRN

## 2016-08-14 NOTE — ED Triage Notes (Addendum)
I have been cold and then been sweating.  It started on Sunday night.  I have been coughing and my head hurts also per pt.

## 2016-08-14 NOTE — Discharge Instructions (Signed)
Rest,  Drink plenty of fluids.  Take motrin or tylenol for achiness and fever reduction, as discussed you may alternate these medicines taking the alternate every 3 hours if needed for persistent fever.  Take phenergan for nausea relief.   Get rechecked for increased shortness of breath,  Increased fever or increasing weakness. Try a bland diet (bananas, applesauce, chicken soup, may sit well on an upset stomach).

## 2016-08-15 NOTE — ED Provider Notes (Addendum)
[facility] Provider Note   CSN: [id number] Arrival date & time: 08/14/16  2123     History   Chief Complaint Chief Complaint  Patient presents with  . Cough    HPI Lawrence Hammond is a 31 y.o. male presenting with a 24 hour episode of flu like symptoms including sudden onset of cold and hot sweats along with myalgias, subjective fever,headache and dry cough.  He denies congestion, sob, chest pain, dizziness, abdominal pain, , vomiting or diarrhea but states he has felt nauseated.  He has been around coworkers with similar symptoms.  He has taken tylenol with transient improvement in his symptoms.  The history is provided by the patient.    History reviewed. No pertinent past medical history.  There are no active problems to display for this patient.   History reviewed. No pertinent surgical history.     Home Medications    Prior to Admission medications   Medication Sig Start Date End Date Taking? Authorizing Provider  benzonatate (TESSALON) 100 MG capsule Take 2 capsules (200 mg total) by mouth 3 (three) times daily as needed for cough. 08/14/16   Burgess AmorJulie Tambra Muller, PA-C  promethazine (PHENERGAN) 25 MG tablet Take 1 tablet (25 mg total) by mouth every 6 (six) hours as needed for nausea or vomiting. 08/14/16   Burgess AmorJulie Hanin Decook, PA-C    Family History History reviewed. No pertinent family history.  Social History Social History  Substance Use Topics  . Smoking status: Never Smoker  . Smokeless tobacco: Never Used  . Alcohol use Yes     Comment: "sometimes"     Allergies   Patient has no known allergies.   Review of Systems Review of Systems  Constitutional: Positive for chills and fever.  HENT: Negative for congestion, ear pain, rhinorrhea, sinus pressure, sore throat, trouble swallowing and voice change.   Eyes: Negative for discharge.  Respiratory: Positive for cough. Negative for shortness of breath, wheezing and stridor.   Cardiovascular: Negative for chest  pain.  Gastrointestinal: Positive for nausea. Negative for abdominal pain, diarrhea and vomiting.  Genitourinary: Negative.      Physical Exam Updated Vital Signs BP 156/93   Pulse 70   Temp 97.6 F (36.4 C) (Oral)   Resp 18   Ht 6\' 4"  (1.93 m)   Wt 83.9 kg   SpO2 100%   BMI 22.52 kg/m   Physical Exam  Constitutional: He appears well-developed and well-nourished.  HENT:  Head: Normocephalic and atraumatic.  Right Ear: Tympanic membrane normal.  Left Ear: Tympanic membrane normal.  Nose: Nose normal.  Mouth/Throat: Oropharynx is clear and moist and mucous membranes are normal.  Eyes: Conjunctivae are normal.  Neck: Normal range of motion. Neck supple.  Cardiovascular: Normal rate, regular rhythm, normal heart sounds and intact distal pulses.   Pulmonary/Chest: Effort normal and breath sounds normal. No respiratory distress. He has no wheezes.  Abdominal: Soft. Bowel sounds are normal. There is no tenderness. There is no guarding.  Musculoskeletal: Normal range of motion.  Neurological: He is alert.  Skin: Skin is warm and dry.  Psychiatric: He has a normal mood and affect.  Nursing note and vitals reviewed.    ED Treatments / Results  Labs (all labs ordered are listed, but only abnormal results are displayed) Labs Reviewed - No data to display  EKG  EKG Interpretation None       Radiology No results found.  Procedures Procedures (including critical care time)  Medications Ordered in ED Medications  benzonatate (TESSALON) capsule 200 mg (200 mg Oral Given 08/14/16 2215)  promethazine (PHENERGAN) tablet 25 mg (25 mg Oral Given 08/14/16 2215)     Initial Impression / Assessment and Plan / ED Course  I have reviewed the triage vital signs and the nursing notes.  Pertinent labs & imaging results that were available during my care of the patient were reviewed by me and considered in my medical decision making (see chart for details).     Pt with ILI with  no acute distress, stable VS. Tessalon for cough, continue tylenol for fevers, phenergan prescribed for nausea. Bland diet, increased fluids.  Discussed role of tamiflu but did not advise he needed this med.  Pt states took last year and made him very nauseated.  Deferred this tx.  Prn f/u anticipated.  Final Clinical Impressions(s) / ED Diagnoses   Final diagnoses:  Influenza-like illness    New Prescriptions Discharge Medication List as of 08/14/2016 10:14 PM    START taking these medications   Details  promethazine (PHENERGAN) 25 MG tablet Take 1 tablet (25 mg total) by mouth every 6 (six) hours as needed for nausea or vomiting., Starting Mon 08/14/2016, Print         Burgess Amor, PA-C 08/15/16 1328    Jacalyn Lefevre, MD 08/15/16 1531    Burgess Amor, PA-C 08/24/16 2130    Jacalyn Lefevre, MD 08/24/16 [phone]

## 2016-12-07 ENCOUNTER — Encounter (HOSPITAL_COMMUNITY): Payer: Self-pay

## 2016-12-07 ENCOUNTER — Emergency Department (HOSPITAL_COMMUNITY)
Admission: EM | Admit: 2016-12-07 | Discharge: 2016-12-07 | Disposition: A | Discharge status: Home / Self Care | Payer: BLUE CROSS/BLUE SHIELD | Attending: Emergency Medicine | Admitting: Emergency Medicine

## 2016-12-07 DIAGNOSIS — R112 Nausea with vomiting, unspecified: Secondary | ICD-10-CM | POA: Insufficient documentation

## 2016-12-07 DIAGNOSIS — R197 Diarrhea, unspecified: Secondary | ICD-10-CM | POA: Insufficient documentation

## 2016-12-07 LAB — BASIC METABOLIC PANEL
ANION GAP: 9 (ref 5–15)
BUN: 15 mg/dL (ref 6–20)
CALCIUM: 9.3 mg/dL (ref 8.9–10.3)
CO2: 28 mmol/L (ref 22–32)
Chloride: 98 mmol/L — ABNORMAL LOW (ref 101–111)
Creatinine, Ser: 1.09 mg/dL (ref 0.61–1.24)
GFR calc Af Amer: >60 mL/min (ref >60)
GFR calc non Af Amer: >60 mL/min (ref >60)
GLUCOSE: 83 mg/dL (ref 65–99)
Potassium: 4 mmol/L (ref 3.5–5.1)
Sodium: 135 mmol/L (ref 135–145)

## 2016-12-07 LAB — URINALYSIS, ROUTINE W REFLEX MICROSCOPIC
Bilirubin Urine: NEGATIVE
GLUCOSE, UA: NEGATIVE mg/dL
HGB URINE DIPSTICK: NEGATIVE
KETONES UR: NEGATIVE mg/dL
Leukocytes, UA: NEGATIVE
NITRITE: NEGATIVE
PROTEIN: NEGATIVE mg/dL
Specific Gravity, Urine: 1.03 (ref 1.005–1.030)
pH: 5 (ref 5.0–8.0)

## 2016-12-07 LAB — CBC WITH DIFFERENTIAL/PLATELET
BASOS PCT: 1 %
Basophils Absolute: 0 10*3/uL (ref 0.0–0.1)
EOS PCT: 2 %
Eosinophils Absolute: 0.2 10*3/uL (ref 0.0–0.7)
HEMATOCRIT: 47 % (ref 39.0–52.0)
Hemoglobin: 17.1 g/dL — ABNORMAL HIGH (ref 13.0–17.0)
Lymphocytes Relative: 33 %
Lymphs Abs: 2.3 10*3/uL (ref 0.7–4.0)
MCH: 29 pg (ref 26.0–34.0)
MCHC: 36.4 g/dL — AB (ref 30.0–36.0)
MCV: 79.7 fL (ref 78.0–100.0)
MONO ABS: 0.8 10*3/uL (ref 0.1–1.0)
MONOS PCT: 11 %
NEUTROS ABS: 3.7 10*3/uL (ref 1.7–7.7)
Neutrophils Relative %: 53 %
Platelets: 225 10*3/uL (ref 150–400)
RBC: 5.9 MIL/uL — ABNORMAL HIGH (ref 4.22–5.81)
RDW: 13.8 % (ref 11.5–15.5)
WBC: 7 10*3/uL (ref 4.0–10.5)

## 2016-12-07 MED ORDER — ONDANSETRON HCL 4 MG/2ML IJ SOLN
4.0000 mg | Freq: Once | INTRAMUSCULAR | Status: AC
  Administered 2016-12-07: 4 mg via INTRAVENOUS
  Filled 2016-12-07: qty 2

## 2016-12-07 MED ORDER — LOPERAMIDE HCL 2 MG PO CAPS
4.0000 mg | ORAL_CAPSULE | Freq: Once | ORAL | Status: AC
  Administered 2016-12-07: 4 mg via ORAL
  Filled 2016-12-07: qty 2

## 2016-12-07 MED ORDER — ONDANSETRON HCL 4 MG PO TABS: 4.0000 mg | ORAL_TABLET | Freq: Four times a day (QID) | ORAL | 0 refills | Status: DC | PRN

## 2016-12-07 MED ORDER — SODIUM CHLORIDE 0.9 % IV BOLUS (SEPSIS)
1000.0000 mL | Freq: Once | INTRAVENOUS | Status: AC
  Administered 2016-12-07: 1000 mL via INTRAVENOUS

## 2016-12-07 NOTE — ED Notes (Signed)
Patient verbalizes understanding of discharge instructions, prescriptions, home care and follow up care. Patient out of department at this time. 

## 2016-12-07 NOTE — Discharge Instructions (Signed)
Take loperamide (Imodium A-D) as needed for diarrhea. ?

## 2016-12-07 NOTE — ED Triage Notes (Signed)
Pt comes in with N/V/D that started 2 days ago. States he is not able to keep anything down. Hasn't taken any medication, but has tried to drink milk.

## 2016-12-07 NOTE — ED Provider Notes (Signed)
[facility] Provider Note   CSN: [id number] Arrival date & time: 12/07/16  1702     History   Chief Complaint Chief Complaint  Patient presents with  . Emesis  . Diarrhea    HPI Lawrence Hammond is a 31 y.o. male.  The history is provided by the patient.  He had onset 2 days ago of nausea, vomiting, diarrhea. Symptoms have been persistent. He he has tried drinking milk without any benefit. He denies fever or chills but has had some night sweats. He complains of abdominal soreness but no true abdominal pain. He complains of generalized weakness. He denies any sick contacts. There has been no blood in stool or emesis.  History reviewed. No pertinent past medical history.  There are no active problems to display for this patient.   History reviewed. No pertinent surgical history.     Home Medications    Prior to Admission medications   Medication Sig Start Date End Date Taking? Authorizing Provider  benzonatate (TESSALON) 100 MG capsule Take 2 capsules (200 mg total) by mouth 3 (three) times daily as needed for cough. 08/14/16   Burgess AmorIdol, Julie, PA-C  promethazine (PHENERGAN) 25 MG tablet Take 1 tablet (25 mg total) by mouth every 6 (six) hours as needed for nausea or vomiting. 08/14/16   Burgess AmorIdol, Julie, PA-C    Family History No family history on file.  Social History Social History  Substance Use Topics  . Smoking status: Never Smoker  . Smokeless tobacco: Never Used  . Alcohol use Yes     Comment: "sometimes"     Allergies   Patient has no known allergies.   Review of Systems Review of Systems  All other systems reviewed and are negative.    Physical Exam Updated Vital Signs BP 137/81 (BP Location: Right Arm)   Pulse 94   Temp 98.2 F (36.8 C) (Oral)   Resp 18   Ht 6\' 4"  (1.93 m)   Wt 88.5 kg (195 lb)   SpO2 100%   BMI 23.74 kg/m   Physical Exam  Nursing note and vitals reviewed.  31 year old male, resting comfortably and in no acute  distress. Vital signs are normal. Oxygen saturation is 100%, which is normal. Head is normocephalic and atraumatic. PERRLA, EOMI. Oropharynx is clear. Neck is nontender and supple without adenopathy or JVD. Back is nontender and there is no CVA tenderness. Lungs are clear without rales, wheezes, or rhonchi. Chest is nontender. Heart has regular rate and rhythm without murmur. Abdomen is soft, flat, nontender without masses or hepatosplenomegaly and peristalsis is hypoactive. Extremities have no cyanosis or edema, full range of motion is present. Skin is warm and dry without rash. Neurologic: Mental status is normal, cranial nerves are intact, there are no motor or sensory deficits.  ED Treatments / Results  Labs (all labs ordered are listed, but only abnormal results are displayed) Labs Reviewed  BASIC METABOLIC PANEL - Abnormal; Notable for the following:       Result Value   Chloride 98 (*)    All other components within normal limits  CBC WITH DIFFERENTIAL/PLATELET - Abnormal; Notable for the following:    RBC 5.90 (*)    Hemoglobin 17.1 (*)    MCHC 36.4 (*)    All other components within normal limits  URINALYSIS, ROUTINE W REFLEX MICROSCOPIC    Procedures Procedures (including critical care time)  Medications Ordered in ED Medications  sodium chloride 0.9 % bolus 1,000 mL (0 mLs  Intravenous Stopped 12/07/16 1847)  ondansetron (ZOFRAN) injection 4 mg (4 mg Intravenous Given 12/07/16 1753)  loperamide (IMODIUM) capsule 4 mg (4 mg Oral Given 12/07/16 1753)     Initial Impression / Assessment and Plan / ED Course  I have reviewed the triage vital signs and the nursing notes.  Pertinent lab results that were available during my care of the patient were reviewed by me and considered in my medical decision making (see chart for details).  Nausea, vomiting, diarrhea. Overall pattern is most consistent with viral gastroenteritis. No red flags to suggest more serious illness.  Screening labs are obtained and he will be given IV fluids, ondansetron, and oral loperamide. Old records are reviewed, and he has no relevant past visits.  Nausea has improved with ondansetron, diarrhea has improved with loperamide. He still feels generally weak. Laboratory workup shows no significant dehydration. He is discharged with prescription for ondansetron and advised use oral loperamide as needed. Given work release for 24 hours.  Final Clinical Impressions(s) / ED Diagnoses   Final diagnoses:  Nausea vomiting and diarrhea    New Prescriptions New Prescriptions   ONDANSETRON (ZOFRAN) 4 MG TABLET    Take 1 tablet (4 mg total) by mouth every 6 (six) hours as needed for nausea.     Dione Booze, MD 12/07/16 [phone]

## 2017-02-07 ENCOUNTER — Emergency Department (HOSPITAL_COMMUNITY)
Admission: EM | Admit: 2017-02-07 | Discharge: 2017-02-07 | Disposition: A | Discharge status: Home / Self Care | Payer: BLUE CROSS/BLUE SHIELD | Attending: Emergency Medicine | Admitting: Emergency Medicine

## 2017-02-07 ENCOUNTER — Emergency Department (HOSPITAL_COMMUNITY): Payer: BLUE CROSS/BLUE SHIELD

## 2017-02-07 ENCOUNTER — Encounter (HOSPITAL_COMMUNITY): Payer: Self-pay | Admitting: *Deleted

## 2017-02-07 DIAGNOSIS — J209 Acute bronchitis, unspecified: Secondary | ICD-10-CM | POA: Diagnosis not present

## 2017-02-07 DIAGNOSIS — R05 Cough: Secondary | ICD-10-CM | POA: Diagnosis present

## 2017-02-07 MED ORDER — BENZONATATE 100 MG PO CAPS: 200.0000 mg | ORAL_CAPSULE | Freq: Three times a day (TID) | ORAL | 0 refills | Status: DC | PRN

## 2017-02-07 MED ORDER — ALBUTEROL SULFATE HFA 108 (90 BASE) MCG/ACT IN AERS
1.0000 | INHALATION_SPRAY | Freq: Once | RESPIRATORY_TRACT | Status: AC
  Administered 2017-02-07: 2 via RESPIRATORY_TRACT
  Filled 2017-02-07: qty 6.7

## 2017-02-07 MED ORDER — ALBUTEROL SULFATE (2.5 MG/3ML) 0.083% IN NEBU
2.5000 mg | INHALATION_SOLUTION | Freq: Once | RESPIRATORY_TRACT | Status: AC
  Administered 2017-02-07: 2.5 mg via RESPIRATORY_TRACT
  Filled 2017-02-07: qty 3

## 2017-02-07 MED ORDER — BENZONATATE 100 MG PO CAPS
200.0000 mg | ORAL_CAPSULE | Freq: Once | ORAL | Status: AC
  Administered 2017-02-07: 200 mg via ORAL
  Filled 2017-02-07: qty 2

## 2017-02-07 MED ORDER — IPRATROPIUM-ALBUTEROL 0.5-2.5 (3) MG/3ML IN SOLN
3.0000 mL | Freq: Once | RESPIRATORY_TRACT | Status: AC
  Administered 2017-02-07: 3 mL via RESPIRATORY_TRACT
  Filled 2017-02-07: qty 3

## 2017-02-07 NOTE — Discharge Instructions (Signed)
Use your inhaler taking 2 puffs every 4 hours if you are coughing, wheezing or short of breath.  You may use the tessalon to help reduce frequency of coughing.  Rest, make sure you are drinking plenty of fluids.

## 2017-02-07 NOTE — ED Triage Notes (Signed)
Pt c/o productive cough for the last day; pt states he is coughing up yellow and white sputum

## 2017-02-09 NOTE — ED Provider Notes (Signed)
[facility] Provider Note   CSN: [id number] Arrival date & time: 02/07/17  1853     History   Chief Complaint Chief Complaint  Patient presents with  . Cough    HPI Lawrence Hammond is a 31 y.o. male presenting with cough and congestion for the past several days, having starting coughing up yellow and white sputum since yesterday.  He endorses subjective fevers and has had shortness of breath with wheezing which seems most noticeable at night. He denies hemoptysis, nasal congestion, sore throat, chest pain, n/v or other symptoms.  Reports feels like his last episode of bronchitis.  He is a non smoker and denies diagnosis of asthma.  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  The history is provided by the patient.    History reviewed. No pertinent past medical history.  There are no active problems to display for this patient.   History reviewed. No pertinent surgical history.     Home Medications    Prior to Admission medications   Medication Sig Start Date End Date Taking? Authorizing Provider  benzonatate (TESSALON) 100 MG capsule Take 2 capsules (200 mg total) by mouth 3 (three) times daily as needed. 02/07/17   Burgess AmorIdol, Chelsye Suhre, PA-C  ondansetron (ZOFRAN) 4 MG tablet Take 1 tablet (4 mg total) by mouth every 6 (six) hours as needed for nausea. 12/07/16   Dione BoozeGlick, David, MD    Family History History reviewed. No pertinent family history.  Social History Social History  Substance Use Topics  . Smoking status: Never Smoker  . Smokeless tobacco: Never Used  . Alcohol use Yes     Comment: "sometimes"     Allergies   Patient has no known allergies.   Review of Systems Review of Systems  Constitutional: Positive for fever. Negative for chills.  HENT: Negative.  Negative for congestion and sore throat.   Eyes: Negative.   Respiratory: Positive for cough, chest tightness, shortness of breath and wheezing.     Cardiovascular: Negative for chest pain.  Gastrointestinal: Negative for abdominal pain and nausea.  Genitourinary: Negative.   Musculoskeletal: Negative for arthralgias, joint swelling and neck pain.  Skin: Negative.  Negative for rash and wound.  Neurological: Negative for dizziness, weakness, light-headedness, numbness and headaches.  Psychiatric/Behavioral: Negative.      Physical Exam Updated Vital Signs BP (!) 135/57   Pulse 97   Temp 98.8 F (37.1 C) (Oral)   Resp 20   Ht 6\' 4"  (1.93 m)   Wt 83.9 kg (185 lb)   SpO2 100%   BMI 22.52 kg/m   Physical Exam  Constitutional: He appears well-developed and well-nourished.  HENT:  Head: Normocephalic and atraumatic.  Eyes: Conjunctivae are normal.  Neck: Normal range of motion.  Cardiovascular: Normal rate, regular rhythm, normal heart sounds and intact distal pulses.   Pulmonary/Chest: Effort normal. He has decreased breath sounds. He has no wheezes. He has no rhonchi. He has no rales.  Decreased breath sounds throughout. No wheeze or rhonchi. Frequent dry sounding cough during exam.   Abdominal: Soft. Bowel sounds are normal. There is no tenderness.  Musculoskeletal: Normal range of motion.  Neurological: He is alert.  Skin: Skin is warm and dry.  Psychiatric: He has a normal mood and affect.  Nursing note and vitals reviewed.    ED Treatments / Results  Labs (all labs ordered are listed, but only abnormal results are displayed) Labs Reviewed - No data to display  EKG  EKG Interpretation None       Radiology Dg Chest 2 View  Result Date: 02/07/2017 CLINICAL DATA:  Productive cough and shortness of breath for 2 days EXAM: CHEST  2 VIEW COMPARISON:  06/14/2016 FINDINGS: The heart size and mediastinal contours are within normal limits. Both lungs are clear. The visualized skeletal structures are unremarkable. IMPRESSION: No active cardiopulmonary disease. Electronically Signed   By: Alcide CleverMark  Lukens M.D.   On:  02/07/2017 19:43    Procedures Procedures (including critical care time)  Medications Ordered in ED Medications  benzonatate (TESSALON) capsule 200 mg (200 mg Oral Given 02/07/17 2200)  albuterol (PROVENTIL HFA;VENTOLIN HFA) 108 (90 Base) MCG/ACT inhaler 1-2 puff (2 puffs Inhalation Provided for home use 02/07/17 2142)  ipratropium-albuterol (DUONEB) 0.5-2.5 (3) MG/3ML nebulizer solution 3 mL (3 mLs Nebulization Given 02/07/17 2139)  albuterol (PROVENTIL) (2.5 MG/3ML) 0.083% nebulizer solution 2.5 mg (2.5 mg Nebulization Given 02/07/17 2139)     Initial Impression / Assessment and Plan / ED Course  I have reviewed the triage vital signs and the nursing notes.  Pertinent labs & imaging results that were available during my care of the patient were reviewed by me and considered in my medical decision making (see chart for details).     Pt given neb tx here, also tessalon with improved breathing, lessened cough. Imaging negative. Tessalon, albuterol mdi given. Prn f/u anticipated. Return precautions discussed.  Final Clinical Impressions(s) / ED Diagnoses   Final diagnoses:  Acute bronchitis, unspecified organism    New Prescriptions Discharge Medication List as of 02/07/2017  9:55 PM    START taking these medications   Details  benzonatate (TESSALON) 100 MG capsule Take 2 capsules (200 mg total) by mouth 3 (three) times daily as needed., Starting Wed 02/07/2017, Print         Burgess Amordol, Lukas Pelcher, PA-C 02/09/17 1304    Rolland PorterJames, Mark, MD 02/19/17 [phone]
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
## 2018-10-29 ENCOUNTER — Emergency Department (HOSPITAL_COMMUNITY): Payer: Self-pay

## 2018-10-29 ENCOUNTER — Emergency Department (HOSPITAL_COMMUNITY)
Admission: EM | Admit: 2018-10-29 | Discharge: 2018-10-29 | Disposition: A | Discharge status: Home / Self Care | Payer: Self-pay | Attending: Emergency Medicine | Admitting: Emergency Medicine

## 2018-10-29 ENCOUNTER — Encounter (HOSPITAL_COMMUNITY): Payer: Self-pay | Admitting: Emergency Medicine

## 2018-10-29 ENCOUNTER — Other Ambulatory Visit: Payer: Self-pay

## 2018-10-29 DIAGNOSIS — R0602 Shortness of breath: Secondary | ICD-10-CM | POA: Insufficient documentation

## 2018-10-29 DIAGNOSIS — R05 Cough: Secondary | ICD-10-CM | POA: Insufficient documentation

## 2018-10-29 DIAGNOSIS — R059 Cough, unspecified: Secondary | ICD-10-CM

## 2018-10-29 DIAGNOSIS — R079 Chest pain, unspecified: Secondary | ICD-10-CM | POA: Insufficient documentation

## 2018-10-29 LAB — COMPREHENSIVE METABOLIC PANEL
ALT: 18 U/L (ref 0–44)
AST: 20 U/L (ref 15–41)
Albumin: 3.8 g/dL (ref 3.5–5.0)
Alkaline Phosphatase: 37 U/L — ABNORMAL LOW (ref 38–126)
Anion gap: 12 (ref 5–15)
BUN: 25 mg/dL — ABNORMAL HIGH (ref 6–20)
CO2: 23 mmol/L (ref 22–32)
Calcium: 8.8 mg/dL — ABNORMAL LOW (ref 8.9–10.3)
Chloride: 103 mmol/L (ref 98–111)
Creatinine, Ser: 1.4 mg/dL — ABNORMAL HIGH (ref 0.61–1.24)
GFR calc Af Amer: >60 mL/min (ref >60)
GFR calc non Af Amer: >60 mL/min (ref >60)
Glucose, Bld: 94 mg/dL (ref 70–99)
Potassium: 4.2 mmol/L (ref 3.5–5.1)
Sodium: 138 mmol/L (ref 135–145)
Total Bilirubin: 0.5 mg/dL (ref 0.3–1.2)
Total Protein: 7.3 g/dL (ref 6.5–8.1)

## 2018-10-29 LAB — TROPONIN I: Troponin I: <0.03 ng/mL (ref <0.03)

## 2018-10-29 LAB — CBC
HCT: 42.9 % (ref 39.0–52.0)
Hemoglobin: 15.3 g/dL (ref 13.0–17.0)
MCH: 29.4 pg (ref 26.0–34.0)
MCHC: 35.7 g/dL (ref 30.0–36.0)
MCV: 82.5 fL (ref 80.0–100.0)
Platelets: 277 10*3/uL (ref 150–400)
RBC: 5.2 MIL/uL (ref 4.22–5.81)
RDW: 13.9 % (ref 11.5–15.5)
WBC: 7.3 10*3/uL (ref 4.0–10.5)
nRBC: 0 % (ref 0.0–0.2)

## 2018-10-29 LAB — LIPASE, BLOOD: Lipase: 36 U/L (ref 11–51)

## 2018-10-29 MED ORDER — LIDOCAINE VISCOUS HCL 2 % MT SOLN
15.0000 mL | Freq: Once | OROMUCOSAL | Status: AC
Start: 2018-10-29 — End: 2018-10-29
  Administered 2018-10-29: 15 mL via ORAL
  Filled 2018-10-29: qty 15

## 2018-10-29 MED ORDER — SUCRALFATE 1 G PO TABS: 1.0000 g | ORAL_TABLET | Freq: Four times a day (QID) | ORAL | 0 refills | Status: AC | PRN

## 2018-10-29 MED ORDER — ALUM & MAG HYDROXIDE-SIMETH 200-200-20 MG/5ML PO SUSP
30.0000 mL | Freq: Once | ORAL | Status: AC
Start: 2018-10-29 — End: 2018-10-29
  Administered 2018-10-29: 30 mL via ORAL
  Filled 2018-10-29: qty 30

## 2018-10-29 NOTE — ED Provider Notes (Signed)
[facility] Department Provider Note MRN:  [id number]  Arrival date & time: 10/29/18     Chief Complaint   Abdominal Pain and Shortness of Breath   History of Present Illness   Ramond CraverRodney Alexopoulos is a 33 y.o. year-old male with no pertinent past medical history presenting to the ED with chief complaint of abdominal pain, shortness of breath.  Patient explains that he has been experiencing cough for the past 1 to 2 weeks.  This symptom is improving.  For the past 3 to 4 days has been experiencing epigastric as well as central chest pain, described as a burning pain.  Worse when laying flat, worse at night.  Denies dizziness, no diaphoresis, no nausea, no vomiting, no diarrhea.  Some shortness of breath with the pain.  Denies numbness or weakness to the arms or legs, no headache or vision change, no dysuria.  Symptoms are constant.  Review of Systems  A complete 10 system review of systems was obtained and all systems are negative except as noted in the HPI and PMH.   Patient's Health History   History reviewed. No pertinent past medical history.  History reviewed. No pertinent surgical history.  No family history on file.  Social History   Socioeconomic History  . Marital status: Single    Spouse name: Not on file  . Number of children: Not on file  . Years of education: Not on file  . Highest education level: Not on file  Occupational History  . Not on file  Social Needs  . Financial resource strain: Not on file  . Food insecurity:    Worry: Not on file    Inability: Not on file  . Transportation needs:    Medical: Not on file    Non-medical: Not on file  Tobacco Use  . Smoking status: Never Smoker  . Smokeless tobacco: Never Used  Substance and Sexual Activity  . Alcohol use: Yes    Comment: "sometimes"  . Drug use: No  . Sexual activity: Not on file  Lifestyle  . Physical activity:    Days per week: Not on file    Minutes per session: Not  on file  . Stress: Not on file  Relationships  . Social connections:    Talks on phone: Not on file    Gets together: Not on file    Attends religious service: Not on file    Active member of club or organization: Not on file    Attends meetings of clubs or organizations: Not on file    Relationship status: Not on file  . Intimate partner violence:    Fear of current or ex partner: Not on file    Emotionally abused: Not on file    Physically abused: Not on file    Forced sexual activity: Not on file  Other Topics Concern  . Not on file  Social History Narrative  . Not on file     Physical Exam  Vital Signs and Nursing Notes reviewed Vitals:   10/29/18 0745 10/29/18 0840  BP: (!) 143/95 (!) 143/79  Pulse: 71 72  Resp: 11 14  Temp:    SpO2: 100% 99%    CONSTITUTIONAL: Well-appearing, NAD NEURO:  Alert and oriented x 3, no focal deficits EYES:  eyes equal and reactive ENT/NECK:  no LAD, no JVD CARDIO: Regular rate, well-perfused, normal S1 and S2 PULM:  CTAB no wheezing or rhonchi GI/GU:  normal bowel sounds, non-distended, non-tender  MSK/SPINE:  No gross deformities, no edema SKIN:  no rash, atraumatic PSYCH:  Appropriate speech and behavior  Diagnostic and Interventional Summary    EKG Interpretation  Date/Time:  Tuesday October 29 2018 07:30:21 EDT Ventricular Rate:  69 PR Interval:    QRS Duration: 91 QT Interval:  383 QTC Calculation: 411 R Axis:   56 Text Interpretation:  Sinus rhythm ST elev, probable normal early repol pattern Confirmed by Kennis Carina [phone]) on 10/29/2018 7:45:32 AM      Labs Reviewed  COMPREHENSIVE METABOLIC PANEL - Abnormal; Notable for the following components:      Result Value   BUN 25 (*)    Creatinine, Ser 1.40 (*)    Calcium 8.8 (*)    Alkaline Phosphatase 37 (*)    All other components within normal limits  CBC  LIPASE, BLOOD  TROPONIN I    DG Chest 2 View  Final Result      Medications  alum & mag  hydroxide-simeth (MAALOX/MYLANTA) 200-200-20 MG/5ML suspension 30 mL (30 mLs Oral Given 10/29/18 0757)    And  lidocaine (XYLOCAINE) 2 % viscous mouth solution 15 mL (15 mLs Oral Given 10/29/18 0757)     Procedures Critical Care  ED Course and Medical Decision Making  I have reviewed the triage vital signs and the nursing notes.  Pertinent labs & imaging results that were available during my care of the patient were reviewed by me and considered in my medical decision making (see below for details).  Suspect lingering bronchitis, doubt pneumonia.  Pain seems more related to GERD or acute viral gastritis.  Abdomen is soft and nontender, very reassuring, normal vital signs, afebrile.  Work-up is unrevealing, normal troponin, normal lipase, normal chest x-ray.  Patient explains that he has had great improvement in his symptoms after GI cocktail.  Appropriate for discharge with Carafate and PCP follow-up.  After the discussed management above, the patient was determined to be safe for discharge.  The patient was in agreement with this plan and all questions regarding their care were answered.  ED return precautions were discussed and the patient will return to the ED with any significant worsening of condition.  Elmer Sow. Pilar Plate, MD [facility] Emergency Medicine [facility] [email or web address]  Final Clinical Impressions(s) / ED Diagnoses     ICD-10-CM   1. Cough R05 DG Chest 2 View    DG Chest 2 View  2. Chest pain R07.9 DG Chest 2 View    DG Chest 2 View    ED Discharge Orders         Ordered    sucralfate (CARAFATE) 1 g tablet  4 times daily PRN     10/29/18 0939             Sabas Sous, MD 10/29/18 [phone]

## 2018-10-29 NOTE — ED Triage Notes (Signed)
Pt reports SOB and abd pain x3 days. Endorses mid abd cramping with some diarrhea. Reports the SOB is all the time

## 2018-10-29 NOTE — Discharge Instructions (Addendum)
You were evaluated in the Emergency Department and after careful evaluation, we did not find any emergent condition requiring admission or further testing in the hospital.  Your symptoms today seem to be due to pain related to inflammation of the stomach.  You can use the medication provided as needed for pain.  Please return to the Emergency Department if you experience any worsening of your condition.  We encourage you to follow up with a primary care provider.  Thank you for allowing [place] to be a part of your care.

## 2018-10-29 NOTE — ED Notes (Signed)
Patient transported to X-ray 

## 2020-04-28 ENCOUNTER — Other Ambulatory Visit: Payer: Self-pay

## 2020-04-28 DIAGNOSIS — Z20822 Contact with and (suspected) exposure to covid-19: Secondary | ICD-10-CM

## 2020-04-29 LAB — SPECIMEN STATUS REPORT

## 2020-04-29 LAB — SARS-COV-2, NAA 2 DAY TAT

## 2020-04-29 LAB — NOVEL CORONAVIRUS, NAA: SARS-CoV-2, NAA: NOT DETECTED

## 2020-08-05 ENCOUNTER — Other Ambulatory Visit: Payer: Self-pay

## 2020-08-05 DIAGNOSIS — Z20822 Contact with and (suspected) exposure to covid-19: Secondary | ICD-10-CM

## 2020-08-06 LAB — SARS-COV-2, NAA 2 DAY TAT

## 2020-08-06 LAB — NOVEL CORONAVIRUS, NAA: SARS-CoV-2, NAA: NOT DETECTED

## 2021-04-22 ENCOUNTER — Other Ambulatory Visit: Payer: Self-pay

## 2021-04-22 ENCOUNTER — Encounter (HOSPITAL_COMMUNITY): Payer: Self-pay | Admitting: *Deleted

## 2021-04-22 ENCOUNTER — Emergency Department (HOSPITAL_COMMUNITY)
Admission: EM | Admit: 2021-04-22 | Discharge: 2021-04-22 | Disposition: A | Discharge status: Home / Self Care | Payer: Self-pay | Attending: Emergency Medicine | Admitting: Emergency Medicine

## 2021-04-22 DIAGNOSIS — Z28310 Unvaccinated for covid-19: Secondary | ICD-10-CM | POA: Insufficient documentation

## 2021-04-22 DIAGNOSIS — U071 COVID-19: Secondary | ICD-10-CM | POA: Insufficient documentation

## 2021-04-22 DIAGNOSIS — B349 Viral infection, unspecified: Secondary | ICD-10-CM | POA: Insufficient documentation

## 2021-04-22 DIAGNOSIS — R109 Unspecified abdominal pain: Secondary | ICD-10-CM | POA: Insufficient documentation

## 2021-04-22 LAB — RESP PANEL BY RT-PCR (FLU A&B, COVID) ARPGX2
Influenza A by PCR: NEGATIVE
Influenza B by PCR: NEGATIVE
SARS Coronavirus 2 by RT PCR: POSITIVE — AB

## 2021-04-22 NOTE — ED Provider Notes (Signed)
[facility] EMERGENCY DEPARTMENT Provider Note   CSN: [id number] Arrival date & time: 04/22/21  1211     History Chief Complaint  Patient presents with   Generalized Body Aches    Lawrence Hammond is a 35 y.o. male.  HPI  Patient with no significant medical history presents to the emergency department chief complaint of URI-like symptoms.  Patient states symptoms started on Wednesday, he endorses headaches, nasal congestion, productive cough, stomach pain, general body aches and decrease in appetite but denies nausea, vomiting, constipation, diarrhea, shortness of breath or chest pain.  Patient states she been taking over-the-counter flu medication without much relief, states that he still tolerating p.o. just states he does not feel as hungry as he normally does.  He has been taking ibuprofen and Tylenol seem to help with his fever.  He is not immunocompromise, does not take any medication on a daily basis, no history of smoking, no history of of chronic diseases, he reports that he is not vaccine against COVID-19, denies  recent sick contacts.  Patient has no other complaints at this time.  History reviewed. No pertinent past medical history.  There are no problems to display for this patient.   History reviewed. No pertinent surgical history.     History reviewed. No pertinent family history.  Social History   Tobacco Use   Smoking status: Never   Smokeless tobacco: Never  Substance Use Topics   Alcohol use: Yes    Comment: "sometimes"   Drug use: No    Home Medications Prior to Admission medications   Medication Sig Start Date End Date Taking? Authorizing Provider  benzonatate (TESSALON) 100 MG capsule Take 2 capsules (200 mg total) by mouth 3 (three) times daily as needed. Patient not taking: Reported on 10/29/2018 02/07/17   Burgess Amor, PA-C  ondansetron (ZOFRAN) 4 MG tablet Take 1 tablet (4 mg total) by mouth every 6 (six) hours as needed for nausea. Patient not  taking: Reported on 10/29/2018 12/07/16   Dione Booze, MD  sucralfate (CARAFATE) 1 g tablet Take 1 tablet (1 g total) by mouth 4 (four) times daily as needed. 10/29/18   Sabas Sous, MD    Allergies    Patient has no known allergies.  Review of Systems   Review of Systems  Constitutional:  Positive for chills and fever.  HENT:  Positive for congestion. Negative for sore throat.   Respiratory:  Positive for cough. Negative for shortness of breath.   Cardiovascular:  Negative for chest pain.  Gastrointestinal:  Positive for abdominal pain. Negative for constipation, diarrhea and vomiting.  Genitourinary:  Negative for enuresis.  Musculoskeletal:  Positive for myalgias. Negative for back pain.  Skin:  Negative for rash.  Neurological:  Positive for headaches. Negative for dizziness.  Hematological:  Does not bruise/bleed easily.   Physical Exam Updated Vital Signs BP (!) 148/86 (BP Location: Right Arm)   Pulse 82   Temp 98.7 F (37.1 C) (Oral)   Resp 19   Ht 6\' 2"  (1.88 m)   Wt 86.2 kg   SpO2 100%   BMI 24.39 kg/m   Physical Exam Vitals and nursing note reviewed.  Constitutional:      General: He is not in acute distress.    Appearance: He is not ill-appearing.  HENT:     Head: Normocephalic and atraumatic.     Right Ear: Tympanic membrane, ear canal and external ear normal.     Left Ear: Tympanic membrane, ear canal  and external ear normal.     Nose: Congestion present.     Comments: Erythematous turbinates bilaterally.    Mouth/Throat:     Mouth: Mucous membranes are moist.     Pharynx: Oropharynx is clear. No oropharyngeal exudate or posterior oropharyngeal erythema.     Comments: Oropharynx is visualized tongue and uvula are both midline, tonsils were symmetrical equal in size, no elevation in tongue, no trismus or torticollis present. Eyes:     Conjunctiva/sclera: Conjunctivae normal.  Cardiovascular:     Rate and Rhythm: Normal rate and regular rhythm.      Pulses: Normal pulses.     Heart sounds: No murmur heard.   No friction rub. No gallop.  Pulmonary:     Effort: No respiratory distress.     Breath sounds: No wheezing, rhonchi or rales.  Abdominal:     Palpations: Abdomen is soft.     Tenderness: There is no abdominal tenderness. There is no right CVA tenderness or left CVA tenderness.  Musculoskeletal:     Right lower leg: No edema.     Left lower leg: No edema.  Skin:    General: Skin is warm and dry.  Neurological:     Mental Status: He is alert.  Psychiatric:        Mood and Affect: Mood normal.    ED Results / Procedures / Treatments   Labs (all labs ordered are listed, but only abnormal results are displayed) Labs Reviewed  RESP PANEL BY RT-PCR (FLU A&B, COVID) [id number]    EKG None  Radiology No results found.  Procedures Procedures   Medications Ordered in ED Medications - No data to display  ED Course  I have reviewed the triage vital signs and the nursing notes.  Pertinent labs & imaging results that were available during my care of the patient were reviewed by me and considered in my medical decision making (see chart for details).    MDM Rules/Calculators/A&P                          Initial impression-patient presents with chief complaint of URI-like symptoms.  He is alert, does not appear in acute stress, vital signs are reassuring.  Will obtain respiratory panel for further evaluation.  Work-up-respiratory panel is pending at this time.  Rule out- Low suspicion for systemic infection as patient is nontoxic-appearing, vital signs reassuring, no obvious source infection noted on exam.  Low suspicion for pneumonia as lung sounds are clear bilaterally, x-ray will be deferred as lung sounds are clear bilaterally very low suspicion for pneumonia at this time.  I have low suspicion for PE as patient denies pleuritic chest pain, shortness of breath, patient is PERC. low suspicion for strep throat as oropharynx  was visualized, no erythema or exudates noted.  Low suspicion patient would need  hospitalized due to viral infection or Covid as vital signs reassuring, patient is not in respiratory distress.    Plan-  URI-likely has a viral infection, will recommend over-the-counter pain medications, hydration, follow-up PCP and/or post-COVID care for further evaluation.  Antiviral treatment will be deferred at this time as patient has very low risk factors for adverse outcome as he has very mild symptoms, no comorbidities, does not smoke.  Vital signs have remained stable, no indication for hospital admission.  Patient given at home care as well strict return precautions.  Patient verbalized that they understood agreed to said plan.  Final Clinical  Impression(s) / ED Diagnoses Final diagnoses:  Viral illness    Rx / DC Orders ED Discharge Orders     None        Carroll Sage, PA-C 04/22/21 1326    Terrilee Files, MD 04/22/21 1810

## 2021-04-22 NOTE — Discharge Instructions (Signed)
You have been seen here for URI like symptoms.  I recommend taking Tylenol for fever control and ibuprofen for pain control please follow dosing on the back of bottle.  I recommend staying hydrated and if you do not an appetite, I recommend soups as this will provide you with fluids and calories.  Your Covid test is pending I recommend self quarantine until you get your results back on MyChart.    If you are Covid positive you must self quarantine for 5 days starting on symptom onset, if at the end of those 5 days you are feeling better you may return back to school/work, if you continue to have symptoms you must self quarantine for additional 5 days.  I would like you to contact "post Covid care" as they will provide you with information how to manage your Covid symptoms if you are Covid positive.  Or if you are Covid negative and continue of symptoms after 1 to 2 weeks you may follow-up with your primary care provider  Come back to the emergency department if you develop chest pain, shortness of breath, severe abdominal pain, uncontrolled nausea, vomiting, diarrhea.  

## 2021-04-22 NOTE — ED Triage Notes (Signed)
Pt c/o generalized body aches with chills and periods of diaphoresis; pt c/o headache

## 2021-08-26 ENCOUNTER — Emergency Department (HOSPITAL_COMMUNITY)
Admission: EM | Admit: 2021-08-26 | Discharge: 2021-08-26 | Disposition: A | Discharge status: Home / Self Care | Payer: Self-pay | Attending: Emergency Medicine | Admitting: Emergency Medicine

## 2021-08-26 ENCOUNTER — Emergency Department (HOSPITAL_COMMUNITY): Payer: Self-pay

## 2021-08-26 ENCOUNTER — Other Ambulatory Visit: Payer: Self-pay

## 2021-08-26 ENCOUNTER — Encounter (HOSPITAL_COMMUNITY): Payer: Self-pay

## 2021-08-26 DIAGNOSIS — W1839XA Other fall on same level, initial encounter: Secondary | ICD-10-CM | POA: Insufficient documentation

## 2021-08-26 DIAGNOSIS — H538 Other visual disturbances: Secondary | ICD-10-CM | POA: Insufficient documentation

## 2021-08-26 DIAGNOSIS — Y990 Civilian activity done for income or pay: Secondary | ICD-10-CM | POA: Insufficient documentation

## 2021-08-26 DIAGNOSIS — R55 Syncope and collapse: Secondary | ICD-10-CM | POA: Insufficient documentation

## 2021-08-26 LAB — CBG MONITORING, ED: Glucose-Capillary: 109 mg/dL — ABNORMAL HIGH (ref 70–99)

## 2021-08-26 LAB — CBC WITH DIFFERENTIAL/PLATELET
Abs Immature Granulocytes: 0.01 10*3/uL (ref 0.00–0.07)
Basophils Absolute: 0 10*3/uL (ref 0.0–0.1)
Basophils Relative: 0 %
Eosinophils Absolute: 0 10*3/uL (ref 0.0–0.5)
Eosinophils Relative: 0 %
HCT: 37.7 % — ABNORMAL LOW (ref 39.0–52.0)
Hemoglobin: 13.8 g/dL (ref 13.0–17.0)
Immature Granulocytes: 0 %
Lymphocytes Relative: 17 %
Lymphs Abs: 1.5 10*3/uL (ref 0.7–4.0)
MCH: 30 pg (ref 26.0–34.0)
MCHC: 36.6 g/dL — ABNORMAL HIGH (ref 30.0–36.0)
MCV: 82 fL (ref 80.0–100.0)
Monocytes Absolute: 0.8 10*3/uL (ref 0.1–1.0)
Monocytes Relative: 10 %
Neutro Abs: 6.5 10*3/uL (ref 1.7–7.7)
Neutrophils Relative %: 73 %
Platelets: 222 10*3/uL (ref 150–400)
RBC: 4.6 MIL/uL (ref 4.22–5.81)
RDW: 14.1 % (ref 11.5–15.5)
WBC: 8.9 10*3/uL (ref 4.0–10.5)
nRBC: 0 % (ref 0.0–0.2)

## 2021-08-26 LAB — COMPREHENSIVE METABOLIC PANEL
ALT: 13 U/L (ref 0–44)
AST: 18 U/L (ref 15–41)
Albumin: 3.9 g/dL (ref 3.5–5.0)
Alkaline Phosphatase: 37 U/L — ABNORMAL LOW (ref 38–126)
Anion gap: 5 (ref 5–15)
BUN: 10 mg/dL (ref 6–20)
CO2: 28 mmol/L (ref 22–32)
Calcium: 8.8 mg/dL — ABNORMAL LOW (ref 8.9–10.3)
Chloride: 103 mmol/L (ref 98–111)
Creatinine, Ser: 1.07 mg/dL (ref 0.61–1.24)
GFR, Estimated: >60 mL/min (ref >60)
Glucose, Bld: 122 mg/dL — ABNORMAL HIGH (ref 70–99)
Potassium: 3.5 mmol/L (ref 3.5–5.1)
Sodium: 136 mmol/L (ref 135–145)
Total Bilirubin: 0.5 mg/dL (ref 0.3–1.2)
Total Protein: 7.5 g/dL (ref 6.5–8.1)

## 2021-08-26 MED ORDER — SODIUM CHLORIDE 0.9 % IV SOLN: INTRAVENOUS | Status: DC

## 2021-08-26 NOTE — Discharge Instructions (Addendum)
You have been provided with the contact information for Dr. Allena Katz, a primary care physician, and Dr. Wyline Mood, a cardiologist.  Please call to get an appointment to establish care in the next 2 to 3 days.  Return to the ED for new or worsening symptoms as discussed, including but not limited to repeat syncope, shortness of breath, chest pain, trauma due to fall, dizziness or lightheadedness, palpitations, or neurological changes

## 2021-08-26 NOTE — ED Provider Notes (Signed)
[facility] EMERGENCY DEPARTMENT Provider Note   CSN: [id number] Arrival date & time: 08/26/21  1552     History  Chief Complaint  Patient presents with   syncope   Lawrence Hammond is a 36 y.o. male presents with a new onset of syncope that occurred today while he was at work.  Patient stated his vision became blurred and he felt warm and sweaty before he lost consciousness.  This was witnessed by a coworker who said he was out for only a few seconds.  He states this occurred while he was simply standing, and he had not been exerting himself prior.  He is unsure whether he hit anything when he fell, but does not complain of any head, neck, or other tenderness.  Denies abdominal pain, recent fever, palpitations, N/V, numbness/tingling, or recent illness.  Denies recent alcohol or recreational drug use.  He endorses 1 prior episode of similar syncope between March and June 2022 in which he was walking out of the house and felt the same prodrome and temporarily lost consciousness.  No prior cardiac or neurological history.  No history of anemia or migraines.  No history of anxiety or panic attacks.  The history is provided by the patient and medical records.      Home Medications Prior to Admission medications   Medication Sig Start Date End Date Taking? Authorizing Provider  benzonatate (TESSALON) 100 MG capsule Take 2 capsules (200 mg total) by mouth 3 (three) times daily as needed. Patient not taking: Reported on 10/29/2018 02/07/17   Evalee Jefferson, PA-C  ondansetron (ZOFRAN) 4 MG tablet Take 1 tablet (4 mg total) by mouth every 6 (six) hours as needed for nausea. Patient not taking: Reported on [id number]   Delora Fuel, MD  sucralfate (CARAFATE) 1 g tablet Take 1 tablet (1 g total) by mouth 4 (four) times daily as needed. 10/29/18   Maudie Flakes, MD      Allergies    Patient has no allergy information on record.    Review of Systems   Review of Systems  Constitutional:   Positive for diaphoresis. Negative for chills and fever.  HENT:  Negative for congestion, ear pain, rhinorrhea and sore throat.   Eyes:  Negative for pain and visual disturbance.  Respiratory:  Negative for cough, chest tightness and shortness of breath.   Cardiovascular:  Negative for chest pain and palpitations.  Gastrointestinal:  Negative for abdominal pain, constipation, diarrhea, nausea and vomiting.  Genitourinary:  Negative for dysuria and hematuria.  Musculoskeletal:  Negative for arthralgias and back pain.  Skin:  Negative for color change and rash.  Neurological:  Positive for syncope. Negative for dizziness, seizures, light-headedness, numbness and headaches.  All other systems reviewed and are negative.  Physical Exam Updated Vital Signs BP 107/87    Pulse 65    Temp 97.7 F (36.5 C) (Oral)    Resp 13    Ht 6\' 4"  (1.93 m)    Wt 80.3 kg    SpO2 100%    BMI 21.55 kg/m  Physical Exam Vitals and nursing note reviewed.  Constitutional:      General: He is not in acute distress.    Appearance: Normal appearance. He is well-developed. He is not ill-appearing or diaphoretic.  HENT:     Head: Normocephalic and atraumatic.  Eyes:     General: No scleral icterus.    Conjunctiva/sclera: Conjunctivae normal.  Cardiovascular:     Rate and Rhythm: Normal rate and  regular rhythm.     Pulses: Normal pulses.          Radial pulses are 2+ on the right side and 2+ on the left side.       Posterior tibial pulses are 2+ on the right side and 2+ on the left side.     Heart sounds: Normal heart sounds. No murmur heard. Pulmonary:     Effort: Pulmonary effort is normal. No respiratory distress.     Breath sounds: Normal breath sounds.  Abdominal:     General: Bowel sounds are normal.     Palpations: Abdomen is soft.     Tenderness: There is no abdominal tenderness.  Musculoskeletal:        General: No swelling.     Cervical back: Neck supple. No rigidity or tenderness.     Right lower  leg: No edema.     Left lower leg: No edema.  Skin:    General: Skin is warm and dry.     Capillary Refill: Capillary refill takes less than 2 seconds.     Coloration: Skin is not pale.     Findings: No bruising.  Neurological:     Mental Status: He is alert and oriented to person, place, and time.  Psychiatric:        Mood and Affect: Mood normal.    ED Results / Procedures / Treatments   Labs (all labs ordered are listed, but only abnormal results are displayed) Labs Reviewed  COMPREHENSIVE METABOLIC PANEL - Abnormal; Notable for the following components:      Result Value   Glucose, Bld 122 (*)    Calcium 8.8 (*)    Alkaline Phosphatase 37 (*)    All other components within normal limits  CBC WITH DIFFERENTIAL/PLATELET - Abnormal; Notable for the following components:   HCT 37.7 (*)    MCHC 36.6 (*)    All other components within normal limits  CBG MONITORING, ED - Abnormal; Notable for the following components:   Glucose-Capillary 109 (*)    All other components within normal limits  URINALYSIS, ROUTINE W REFLEX MICROSCOPIC    EKG EKG Interpretation  Date/Time:  Friday August 26 2021 16:09:39 EST Ventricular Rate:  73 PR Interval:  154 QRS Duration: 88 QT Interval:  385 QTC Calculation: 425 R Axis:   68 Text Interpretation: Sinus rhythm ST elev, probable normal early repol pattern Confirmed by Noemi Chapel [phone]) on 08/26/2021 7:03:25 PM  Radiology DG Chest 2 View  Result Date: 08/26/2021 CLINICAL DATA:  Syncope. EXAM: CHEST - 2 VIEW COMPARISON:  October 29, 2018. FINDINGS: No consolidation. No visible pleural effusions or pneumothorax. Cardiomediastinal silhouette is within normal limits. No evidence of acute osseous abnormality. IMPRESSION: No evidence of acute cardiopulmonary disease. Electronically Signed   By: Margaretha Sheffield M.D.   On: 08/26/2021 17:19    Procedures Procedures    Medications Ordered in ED Medications  0.9 %  sodium chloride  infusion ( Intravenous New Bag/Given 08/26/21 1759)    ED Course/ Medical Decision Making/ A&P                           Medical Decision Making Amount and/or Complexity of Data Reviewed Labs: ordered. Radiology: ordered.  Risk Prescription drug management.   36 year old male presents to the ED for concern of new syncopal episode.  This involves an extensive number of treatment options, and is a complaint that carries with it a  high risk of complications and morbidity.  The differential diagnosis includes an arrhythmia, vasovagal, orthostatic hypotension, seizure, vascular abnormality  Additional history obtained from internal/external records available via epic  Interpretation: I ordered, and personally interpreted labs.  The pertinent results include: Electrolytes unremarkable.  Kidney function normal with creatinine of 1.07 and BUN of 10.  WBC is not elevated thus do not suspect an infectious cause.  Hemoglobin and hematocrit within normal range, do not suspect anemia as contributing cause.  Glucose values of 109 and 122 did not suggest episode of hypoglycemia.  I ordered imaging studies including chest x-ray and EKG.  I independently visualized and interpreted imaging which showed no acute cardiopulmonary disease or pathology.  I agree with the radiologist interpretation.  EKG did not show any significant ST changes, ischemic changes, or arrhythmias such as Brugada syndrome or Wolff-Parkinson-White.  Intervention: I ordered medication including IV fluids for fluid replenishment.  Reevaluation of the patient after these medicines showed that the patient tolerated this well.  I have reviewed the patients home medicines and have made adjustments as needed  ED Course: Patient appears well on physical exam.  Labs and imaging are unremarkable for obvious cardiac, pulmonary, or neurogenic cause.  Syncopal episodes did not occur with exertion or with seizure-like activity according to patient  and bystanders.  No suggestive cardiac history or family cardiac history.  No lack of sensation or motor skills overall on physical exam.  Patient alert and oriented x3 and not experiencing any symptoms in the ED.  No accompanying nausea or vomiting, or cardiac symptoms that preceded the syncopal event.  Orthostatic vital signs appear normal today and there is no difference in the strength of the upper and lower extremity pulses bilaterally to suggest vascular origin.  Overall, I am uncertain the exact etiology of the patient's symptoms.  However, I do not believe he is currently experiencing a medical, surgical, or psychiatric emergency.  I believe the patient may benefit from an outpatient follow-up with primary care and possibly a cardiologist for further evaluation.    Dispostion: I discussed the patient and their case with my attending, Dr. Sabra Heck, who agreed with the proposed treatment course.  After consideration of the diagnostic results and the patient's response to treatment, I feel that the patent would benefit from outpatient follow-up with a primary care and possibly cardiology office for further evaluation and treatment.  Discussed course of treatment thoroughly with the patient and he demonstrated understanding.  Patient in agreement and has no further questions.         Final Clinical Impression(s) / ED Diagnoses Final diagnoses:  Syncope, unspecified syncope type    Rx / DC Orders ED Discharge Orders     None         Candace Cruise 1934    Noemi Chapel, MD 08/27/21 1014

## 2021-08-26 NOTE — ED Notes (Signed)
Patient arrives via EMS, patient stated that he was at work c/o feeling hot, vision became blurry and passed out at work. Denies chest pain, shortness at breath at work but no complaints of shortness of breath at present. Denies nausea and vomiting.

## 2021-09-02 ENCOUNTER — Other Ambulatory Visit: Payer: Self-pay

## 2021-09-02 ENCOUNTER — Ambulatory Visit (INDEPENDENT_AMBULATORY_CARE_PROVIDER_SITE_OTHER): Payer: Self-pay | Admitting: Cardiology

## 2021-09-02 ENCOUNTER — Encounter: Payer: Self-pay | Admitting: *Deleted

## 2021-09-02 ENCOUNTER — Encounter: Payer: Self-pay | Admitting: Cardiology

## 2021-09-02 VITALS — BP 134/86 | HR 94 | Ht 75.0 in | Wt 154.2 lb

## 2021-09-02 DIAGNOSIS — R55 Syncope and collapse: Secondary | ICD-10-CM

## 2021-09-02 NOTE — Patient Instructions (Addendum)
Medication Instructions:  Your physician recommends that you continue on your current medications as directed. Please refer to the Current Medication list given to you today.   Labwork: none  Testing/Procedures: Your physician has requested that you have a echocardiogram. Echocardiography is a painless test that uses sound waves to create images of your heart. It provides your doctor with information about the size and shape of your heart and how well your hearts chambers and valves are working. This procedure takes approximately one hour. There are no restrictions for this procedure. Your physician has recommended that you wear an event monitor for 21 days. Event monitors are medical devices that record the hearts electrical activity. Doctors most often [place] these monitors to diagnose arrhythmias. Arrhythmias are problems with the speed or rhythm of the heartbeat. The monitor is a small, portable device. You can wear one while you do your normal daily activities. This is usually used to diagnose what is causing palpitations/syncope (passing out).   Follow-Up: Your physician recommends that you schedule a follow-up appointment in: 2 month follow up  Any Other Special Instructions Will Be Listed Below (If Applicable).  If you need a refill on your cardiac medications before your next appointment, please call your pharmacy.

## 2021-09-02 NOTE — Progress Notes (Signed)
Clinical Summary Mr. Brunty is a 36 y.o.male seen today as a new patient for the following medical problems.    1.Syncope - seen in ER 08/26/2021 for episode of syncope - Cr 1.07 BUN 10. EKG NSR  - 2 total episodes  - first episode was last year - occurred while at his sisters house in June. Was walking outside. Felt lightheaded, some nausea, some palpitations. Started to fall but family caught him. He reports LOC for a few seconds. Had some blurry vision. Went layed down and felt better. No EtOH use. Had not been out in heat   - second episdoe 08/2021 at work.No issues prior episode. Was standing talking to coworker, felt lightheaded all of a sudden. No nausea, some palpitations, +diaphoresis. Larey Seat but coworker caught him, nonresponsive for 15-20 seconds.   - he is a vegan. Does not eat breakfast in the morning - drinks water regularly throughout the day - does heavy walking/running with his dogs daily up and down hill     SH: works as Publishing copy  No past medical history on file.   Not on File   Current Outpatient Medications  Medication Sig Dispense Refill   benzonatate (TESSALON) 100 MG capsule Take 2 capsules (200 mg total) by mouth 3 (three) times daily as needed. (Patient not taking: Reported on 10/29/2018) 30 capsule 0   ondansetron (ZOFRAN) 4 MG tablet Take 1 tablet (4 mg total) by mouth every 6 (six) hours as needed for nausea. (Patient not taking: Reported on 10/29/2018) 12 tablet 0   sucralfate (CARAFATE) 1 g tablet Take 1 tablet (1 g total) by mouth 4 (four) times daily as needed. 30 tablet 0   No current facility-administered medications for this visit.     No past surgical history on file.   Not on File    No family history on file.   Social History Mr. Bargar reports that he has never smoked. He has never used smokeless tobacco. Mr. Olden reports that he does not currently use alcohol.   Review of Systems CONSTITUTIONAL: No  weight loss, fever, chills, weakness or fatigue.  HEENT: Eyes: No visual loss, blurred vision, double vision or yellow sclerae.No hearing loss, sneezing, congestion, runny nose or sore throat.  SKIN: No rash or itching.  CARDIOVASCULAR: per hpi RESPIRATORY: No shortness of breath, cough or sputum.  GASTROINTESTINAL: No anorexia, nausea, vomiting or diarrhea. No abdominal pain or blood.  GENITOURINARY: No burning on urination, no polyuria NEUROLOGICAL: No headache, dizziness, syncope, paralysis, ataxia, numbness or tingling in the extremities. No change in bowel or bladder control.  MUSCULOSKELETAL: No muscle, back pain, joint pain or stiffness.  LYMPHATICS: No enlarged nodes. No history of splenectomy.  PSYCHIATRIC: No history of depression or anxiety.  ENDOCRINOLOGIC: No reports of sweating, cold or heat intolerance. No polyuria or polydipsia.  Marland Kitchen   Physical Examination Today's Vitals   09/02/21 1508 09/02/21 1509 09/02/21 1510 09/02/21 1512  BP: 136/80 136/81 135/83 134/86  Pulse: 76 91 90 94  SpO2:   98%   Weight:      Height:       Body mass index is 19.27 kg/m.  Gen: resting comfortably, no acute distress HEENT: no scleral icterus, pupils equal round and reactive, no palptable cervical adenopathy,  CV: RRR, no m/r/g no jvd Resp: Clear to auscultation bilaterally GI: abdomen is soft, non-tender, non-distended, normal bowel sounds, no hepatosplenomegaly MSK: extremities are warm, no edema.  Skin: warm, no rash Neuro:  no focal deficits Psych: appropriate affect      Assessment and Plan  1.Syncope - unclear etiology - orthostatics are negative in clinic - baselien EKG is normal - obtain echo, obtain 21 day event monitr   F/u 2 months       Antoine Poche, M.D.

## 2021-09-07 ENCOUNTER — Other Ambulatory Visit: Payer: Self-pay

## 2021-09-14 ENCOUNTER — Ambulatory Visit (INDEPENDENT_AMBULATORY_CARE_PROVIDER_SITE_OTHER): Payer: Self-pay

## 2021-09-14 DIAGNOSIS — R55 Syncope and collapse: Secondary | ICD-10-CM

## 2021-09-20 ENCOUNTER — Ambulatory Visit (INDEPENDENT_AMBULATORY_CARE_PROVIDER_SITE_OTHER): Payer: Self-pay

## 2021-09-20 DIAGNOSIS — R55 Syncope and collapse: Secondary | ICD-10-CM

## 2021-09-21 LAB — ECHOCARDIOGRAM COMPLETE
AR max vel: 3.37 cm2
AV Area VTI: 3.08 cm2
AV Area mean vel: 3.25 cm2
AV Mean grad: 3 mmHg
AV Peak grad: 4.8 mmHg
Ao pk vel: 1.1 m/s
Area-P 1/2: 5.23 cm2
Calc EF: 57 %
S' Lateral: 3.83 cm
Single Plane A2C EF: 62.6 %
Single Plane A4C EF: 51.1 %

## 2021-10-06 ENCOUNTER — Telehealth: Payer: Self-pay | Admitting: Cardiology

## 2021-10-06 NOTE — Telephone Encounter (Signed)
?  Lawrence Hammond is calling to find out if he can go back to work now. Please advise.  ?

## 2021-10-10 ENCOUNTER — Encounter: Payer: Self-pay | Admitting: *Deleted

## 2021-10-10 ENCOUNTER — Telehealth: Payer: Self-pay | Admitting: *Deleted

## 2021-10-10 NOTE — Telephone Encounter (Signed)
I have is Echo results.  Uploaded monitor results to you today.  Please advise on return to work.  He has 2 month f/u scheduled for 11/11/2021.   ?

## 2021-10-10 NOTE — Telephone Encounter (Signed)
Lesle Chris, LPN  ?0/62/6948  3:37 PM EDT Back to Top  ?  ?Correction, no pcp.   ? Lesle Chris, LPN  ?5/46/2703  3:35 PM EDT   ?  ?Notified via my chart.  Copy to pcp.   ? ?

## 2021-10-10 NOTE — Telephone Encounter (Signed)
-----   Message from Antoine Poche, MD sent at 10/05/2021  4:01 PM EDT ----- ?Normal echo ? ? ?Dominga Ferry MD ?

## 2021-10-12 NOTE — Telephone Encounter (Signed)
Normal heart monitor and echo. Nothing abnormal from cardiac standpoint. Can we check with him to see if any recurrent symptoms of passing out ? ? ?J Capers Hagmann MD ?

## 2021-10-13 ENCOUNTER — Encounter: Payer: Self-pay | Admitting: *Deleted

## 2021-10-13 NOTE — Telephone Encounter (Signed)
Message sent via my chart

## 2021-10-19 NOTE — Telephone Encounter (Signed)
Request virtual visit-will give time and date once provider confirms ? ?  ?Patient Consent for Virtual Visit  ? ? ?   ? ?Lawrence Hammond has provided verbal consent on 10/19/2021 for a virtual visit (video or telephone). ? ? ?CONSENT FOR VIRTUAL VISIT FOR:  Lawrence Hammond  ?By participating in this virtual visit I agree to the following: ? ?I hereby voluntarily request, consent and authorize [facility] and its employed or contracted physicians, physician assistants, nurse practitioners or other licensed health care professionals (the Practitioner), to provide me with telemedicine health care services (the ?Services") as deemed necessary by the treating Practitioner. I acknowledge and consent to receive the Services by the Practitioner via telemedicine. I understand that the telemedicine visit will involve communicating with the Practitioner through live audiovisual communication technology and the disclosure of certain medical information by electronic transmission. I acknowledge that I have been given the opportunity to request an in-person assessment or other available alternative prior to the telemedicine visit and am voluntarily participating in the telemedicine visit. ? ?I understand that I have the right to withhold or withdraw my consent to the use of telemedicine in the course of my care at any time, without affecting my right to future care or treatment, and that the Practitioner or I may terminate the telemedicine visit at any time. I understand that I have the right to inspect all information obtained and/or recorded in the course of the telemedicine visit and may receive copies of available information for a reasonable fee.  I understand that some of the potential risks of receiving the Services via telemedicine include:  ?Delay or interruption in medical evaluation due to technological equipment failure or disruption; ?Information transmitted may not be sufficient (e.g. poor resolution of images) to  allow for appropriate medical decision making by the Practitioner; and/or  ?In rare instances, security protocols could fail, causing a breach of personal health information. ? ?Furthermore, I acknowledge that it is my responsibility to provide information about my medical history, conditions and care that is complete and accurate to the best of my ability. I acknowledge that Practitioner's advice, recommendations, and/or decision may be based on factors not within their control, such as incomplete or inaccurate data provided by me or distortions of diagnostic images or specimens that may result from electronic transmissions. I understand that the practice of medicine is not an exact science and that Practitioner makes no warranties or guarantees regarding treatment outcomes. I acknowledge that a copy of this consent can be made available to me via my patient portal [facility] MyChart), or I can request a printed copy by calling the office of [facility].   ? ?I understand that my insurance will be billed for this visit.  ? ?I have read or had this consent read to me. ?I understand the contents of this consent, which adequately explains the benefits and risks of the Services being provided via telemedicine.  ?I have been provided ample opportunity to ask questions regarding this consent and the Services and have had my questions answered to my satisfaction. ?I give my informed consent for the services to be provided through the use of telemedicine in my medical care ? ? ?

## 2021-10-19 NOTE — Telephone Encounter (Signed)
Virtual appointment with Dr. Wyline Mood on Friday, 10/21/2021 @12  noon ?Patient informed and verbalized understanding of plan. ?

## 2021-10-21 ENCOUNTER — Encounter: Payer: Self-pay | Admitting: Cardiology

## 2021-10-21 ENCOUNTER — Ambulatory Visit (INDEPENDENT_AMBULATORY_CARE_PROVIDER_SITE_OTHER): Payer: Self-pay | Admitting: Cardiology

## 2021-10-21 VITALS — BP 140/101 | HR 94 | Ht 75.0 in | Wt 157.0 lb

## 2021-10-21 DIAGNOSIS — R55 Syncope and collapse: Secondary | ICD-10-CM

## 2021-10-21 DIAGNOSIS — R002 Palpitations: Secondary | ICD-10-CM

## 2021-10-21 MED ORDER — METOPROLOL TARTRATE 25 MG PO TABS: 12.5000 mg | ORAL_TABLET | Freq: Two times a day (BID) | ORAL | 3 refills | Status: DC

## 2021-10-21 NOTE — Patient Instructions (Signed)
Medication Instructions:  ?Start Lopressor 12.5 mg tablets twice daily ? ?Follow-Up: ?Follow up with Dr. Wyline Mood in 6 months.  ? ?Any Other Special Instructions Will Be Listed Below (If Applicable). ? ? ? ? ?If you need a refill on your cardiac medications before your next appointment, please call your pharmacy. ? ?

## 2021-10-21 NOTE — Progress Notes (Signed)
? ?Virtual Visit via Telephone Note  ? ?This visit type was conducted due to national recommendations for restrictions regarding the COVID-19 Pandemic (e.g. social distancing) in an effort to limit this patient's exposure and mitigate transmission in our community.  Due to his co-morbid illnesses, this patient is at least at moderate risk for complications without adequate follow up.  This format is felt to be most appropriate for this patient at this time.  The patient did not have access to video technology/had technical difficulties with video requiring transitioning to audio format only (telephone).  All issues noted in this document were discussed and addressed.  No physical exam could be performed with this format.  Please refer to the patient's chart for his  consent to telehealth for [facility].  ? ? ?Date:  10/21/2021  ? ?ID:  Lawrence Hammond, DOB 04-09-1986, MRN [id number] ?The patient was identified using 2 identifiers. ? ?Patient Location: Home ?Provider Location: Office/Clinic ? ? ?PCP:  Patient, No Pcp Per (Inactive) ?  ?[facility] Providers ?Cardiologist:  Dina Rich, MD    ? ?Evaluation Performed:  Follow-Up Visit ? ?Chief Complaint:  Follow up ? ?History of Present Illness:   ? ?Lawrence Hammond is a 36 y.o. male seen today for follow up of the following medical problems.  ? ? ?1.Syncope ?- seen in ER 08/26/2021 for episode of syncope ?- Cr 1.07 BUN 10. EKG NSR ?  ?- 2 total episodes ?  ?- first episode was last year ?- occurred while at his sisters house in June. Was walking outside. Felt lightheaded, some nausea, some palpitations. Started to fall but family caught him. He reports LOC for a few seconds. Had some blurry vision. Went layed down and felt better. No EtOH use. Had not been out in heat ?  ?  ?- second episdoe 08/2021 at work.No issues prior episode. Was standing talking to coworker, felt lightheaded all of a sudden. No nausea, some palpitations, +diaphoresis. Larey Seat but coworker  caught him, nonresponsive for 15-20 seconds.  ?  ?- he is a vegan. Does not eat breakfast in the morning ?- drinks water regularly throughout the day ?- does heavy walking/running with his dogs daily up and down hill ?  ?  ?- orthostatics were negative in clinic ?- 09/2021 echo LVEF 55-60%, mild MR,  ? - 09/2021 21 day monitor: rare ectopy ?- can have some palpitations at times since our last visit. No recurrent syncope or presyncope ?  ?SH: works as Publishing copy ? ? ? ?The patient does not have symptoms concerning for COVID-19 infection (fever, chills, cough, or new shortness of breath).  ? ? ?Past Medical History:  ?Diagnosis Date  ? No pertinent past medical history   ? ?No past surgical history on file.  ? ?No outpatient medications have been marked as taking for the 10/21/21 encounter (Appointment) with Antoine Poche, MD.  ?  ? ?Allergies:   Patient has no known allergies.  ? ?Social History  ? ?Tobacco Use  ? Smoking status: Some Days  ?  Types: Cigarettes  ?  Start date: 01/03/2004  ? Smokeless tobacco: Never  ? Tobacco comments:  ?  Smokes 2-3 times per week  ?Vaping Use  ? Vaping Use: Never used  ?Substance Use Topics  ? Alcohol use: Not Currently  ?  Comment: "sometimes"  ? Drug use: No  ?  ? ?Family Hx: ?The patient's family history includes Diabetes in his paternal grandmother. ? ?ROS:   ?Please see  the history of present illness.    ? ?All other systems reviewed and are negative. ? ? ?Prior CV studies:   ?The following studies were reviewed today: ? ?09/2021 echo ?IMPRESSIONS  ? ? ? 1. Left ventricular ejection fraction, by estimation, is 55 to 60%. The  ?left ventricle has normal function. The left ventricle has no regional  ?wall motion abnormalities. Left ventricular diastolic parameters were  ?normal. The average left ventricular  ?global longitudinal strain is -16.0 %. The global longitudinal strain is  ?normal.  ? 2. Right ventricular systolic function is normal. The right ventricular  ?size  is normal.  ? 3. The mitral valve is normal in structure. Mild mitral valve  ?regurgitation. No evidence of mitral stenosis.  ? 4. The tricuspid valve is abnormal.  ? 5. The aortic valve is tricuspid. Aortic valve regurgitation is not  ?visualized. No aortic stenosis is present.  ? 6. The inferior vena cava is normal in size with <50% respiratory  ?variability, suggesting right atrial pressure of 8 mmHg. ? ?09/2021 monitor ?21 day event monitor ?Min HR 49, Max HR 175, Avg HR 80 ?Reported symptoms correlated with sinus rhythm, rare PACs and PVCs. ?No significant arrhythmias ? ? ?Labs/Other Tests and Data Reviewed:   ? ?EKG:  No ECG reviewed. ? ?Recent Labs: ?08/26/2021: ALT 13; BUN 10; Creatinine, Ser 1.07; Hemoglobin 13.8; Platelets 222; Potassium 3.5; Sodium 136  ? ?Recent Lipid Panel ?No results found for: CHOL, TRIG, HDL, CHOLHDL, LDLCALC, LDLDIRECT ? ?Wt Readings from Last 3 Encounters:  ?09/02/21 154 lb 3.2 oz (69.9 kg)  ?08/26/21 177 lb (80.3 kg)  ?04/22/21 190 lb (86.2 kg)  ?  ? ?Risk Assessment/Calculations:   ? ? ?    ?Objective:   ? ?Vital Signs:   ?Today's Vitals  ? 10/21/21 1107  ?BP: (!) 140/101  ?Pulse: 94  ?Weight: 157 lb (71.2 kg)  ?Height: 6\' 3"  (1.905 m)  ? ?Body mass index is 19.62 kg/m?. ? ?Normal affect. Normal speech pattern and tone. Comfortable, no apparent distress. No audible signs of sob or wheezing.  ? ?ASSESSMENT & PLAN:   ? ?1.Syncope ?-orthostatics were negative, benign echo and 21 day monitor ?- perhaps vasovagal syncope, certaintly no evidence of cardiac cause ?- no recurrent episodes. Continue aggressive hydration ?- no further workup at this time ? ?2. Palpitations ?- start lopressor 12.5mg  bid ?- recent monitor showed benign ectopy ? ?Will give note ok to return to work starting 10/31/21 ? ?   ? ?   ? ? ?COVID-19 Education: ?The signs and symptoms of COVID-19 were discussed with the patient and how to seek care for testing (follow up with PCP or arrange E-visit).  The importance of  social distancing was discussed today. ? ?Time:   ?Today, I have spent 18 minutes with the patient with telehealth technology discussing the above problems.   ? ? ?Medication Adjustments/Labs and Tests Ordered: ?Current medicines are reviewed at length with the patient today.  Concerns regarding medicines are outlined above.  ? ?Tests Ordered: ?No orders of the defined types were placed in this encounter. ? ? ?Medication Changes: ?No orders of the defined types were placed in this encounter. ? ? ?Follow Up:  In Person 6 month f/u ? ?Signed, ?11/02/21, MD  ?10/21/2021 9:50 AM    ?[facility] ? ?

## 2021-10-24 ENCOUNTER — Telehealth: Payer: Self-pay | Admitting: Cardiology

## 2021-11-11 ENCOUNTER — Ambulatory Visit: Payer: Self-pay | Admitting: Cardiology

## 2021-11-17 ENCOUNTER — Telehealth: Payer: Self-pay | Admitting: Cardiology

## 2021-11-17 NOTE — Telephone Encounter (Signed)
What time frame is he asking for? Can we clarify, prior message noted some ongoing fatigue perhaps from the medication. We could possibly change the medication, please clarify any ongoing symptoms ? ?Dominga Ferry MD ?

## 2021-11-17 NOTE — Telephone Encounter (Signed)
Patient is requesting a work excuse letter from Dr. Harl Bowie ?

## 2021-11-17 NOTE — Telephone Encounter (Signed)
Reports fatigue for the past 2 weeks that started after taking lopressor. Says its not constant and doesn't happen everyday. Says he's had dizziness and chest pain for the past 3 months that is unchanged. ?Request note for work for 1-2 months ?

## 2021-11-21 ENCOUNTER — Encounter: Payer: Self-pay | Admitting: *Deleted

## 2021-11-21 MED ORDER — DILTIAZEM HCL 30 MG PO TABS: 30.0000 mg | ORAL_TABLET | Freq: Two times a day (BID) | ORAL | 2 refills | Status: AC

## 2021-11-21 NOTE — Telephone Encounter (Signed)
Can extend work leave 1 month from today. Please stop lorpessor and start diltiazem 30mg  bid, update on symptoms 1 weeks ? ?J Rahsaan Weakland MD ?

## 2021-11-21 NOTE — Telephone Encounter (Signed)
Noted. Letter printed and waiting at the front of office.  ?

## 2021-11-21 NOTE — Telephone Encounter (Signed)
Patient informed and verbalized understanding of plan. ?Letter made available in mychart and also advised he can pick up at [place] office since he didn't have a ride to pick up in [place].  ?

## 2022-04-12 DIAGNOSIS — Z202 Contact with and (suspected) exposure to infections with a predominantly sexual mode of transmission: Secondary | ICD-10-CM | POA: Diagnosis not present

## 2022-04-12 DIAGNOSIS — R369 Urethral discharge, unspecified: Secondary | ICD-10-CM | POA: Insufficient documentation

## 2022-04-12 DIAGNOSIS — R300 Dysuria: Secondary | ICD-10-CM | POA: Diagnosis not present

## 2022-04-13 ENCOUNTER — Encounter (HOSPITAL_COMMUNITY): Payer: Self-pay | Admitting: Emergency Medicine

## 2022-04-13 ENCOUNTER — Other Ambulatory Visit: Payer: Self-pay

## 2022-04-13 ENCOUNTER — Emergency Department (HOSPITAL_COMMUNITY)
Admission: EM | Admit: 2022-04-13 | Discharge: 2022-04-13 | Disposition: A | Discharge status: Home / Self Care | Payer: Commercial Managed Care - HMO | Attending: Emergency Medicine | Admitting: Emergency Medicine

## 2022-04-13 DIAGNOSIS — N342 Other urethritis: Secondary | ICD-10-CM

## 2022-04-13 DIAGNOSIS — Z202 Contact with and (suspected) exposure to infections with a predominantly sexual mode of transmission: Secondary | ICD-10-CM

## 2022-04-13 LAB — URINALYSIS, ROUTINE W REFLEX MICROSCOPIC
Bacteria, UA: NONE SEEN
Bilirubin Urine: NEGATIVE
Glucose, UA: NEGATIVE mg/dL
Hgb urine dipstick: NEGATIVE
Ketones, ur: 5 mg/dL — AB
Nitrite: NEGATIVE
Protein, ur: NEGATIVE mg/dL
Specific Gravity, Urine: 1.014 (ref 1.005–1.030)
WBC, UA: >50 WBC/hpf — ABNORMAL HIGH (ref 0–5)
pH: 6 (ref 5.0–8.0)

## 2022-04-13 MED ORDER — AZITHROMYCIN 250 MG PO TABS
1000.0000 mg | ORAL_TABLET | Freq: Once | ORAL | Status: AC
  Administered 2022-04-13: 1000 mg via ORAL
  Filled 2022-04-13: qty 4

## 2022-04-13 MED ORDER — STERILE WATER FOR INJECTION IJ SOLN
INTRAMUSCULAR | Status: AC
  Filled 2022-04-13: qty 10

## 2022-04-13 MED ORDER — CEFTRIAXONE SODIUM 500 MG IJ SOLR
500.0000 mg | Freq: Once | INTRAMUSCULAR | Status: AC
  Administered 2022-04-13: 500 mg via INTRAMUSCULAR
  Filled 2022-04-13: qty 500

## 2022-04-13 NOTE — ED Triage Notes (Signed)
Pt in with burning sensation while peeing, penile discharge for a few days. States he had unprotected sex 2wks ago

## 2022-04-13 NOTE — ED Notes (Signed)
Patient verbalizes understanding of discharge instructions. Opportunity for questioning and answers were provided. Armband removed by staff, pt discharged from ED. Ambulated out to lobby  

## 2022-04-13 NOTE — Discharge Instructions (Signed)
We will call you if your cultures indicate you require further treatment or need to take additional action. 

## 2022-04-13 NOTE — ED Provider Notes (Signed)
  [facility] EMERGENCY DEPARTMENT Provider Note   CSN: [id number] Arrival date & time: 04/12/22  2308     History  Chief Complaint  Patient presents with   Exposure to STD    Lawrence Hammond is a 36 y.o. male.  Patient is a 36 year old male presenting with complaints of burning with urination and urethral discharge.  This began several days ago, approximately 1 week after having unprotected intercourse with a new partner.  He denies any fevers or chills.  He denies any blisters or rash.  The history is provided by the patient.       Home Medications Prior to Admission medications   Medication Sig Start Date End Date Taking? Authorizing Provider  diltiazem (CARDIZEM) 30 MG tablet Take 1 tablet (30 mg total) by mouth 2 (two) times daily. 11/21/21   Arnoldo Lenis, MD  sucralfate (CARAFATE) 1 g tablet Take 1 tablet (1 g total) by mouth 4 (four) times daily as needed. Patient not taking: Reported on 09/02/2021 10/29/18   Maudie Flakes, MD      Allergies    Patient has no known allergies.    Review of Systems   Review of Systems  All other systems reviewed and are negative.   Physical Exam Updated Vital Signs BP (!) 137/114   Pulse (!) 57   Temp 98.2 F (36.8 C) (Oral)   Wt 79.4 kg   SpO2 100%   BMI 21.87 kg/m  Physical Exam Vitals and nursing note reviewed.  Constitutional:      General: He is not in acute distress.    Appearance: Normal appearance. He is not ill-appearing.  HENT:     Head: Normocephalic and atraumatic.  Pulmonary:     Effort: Pulmonary effort is normal.  Skin:    General: Skin is warm and dry.  Neurological:     Mental Status: He is alert and oriented to person, place, and time.     ED Results / Procedures / Treatments   Labs (all labs ordered are listed, but only abnormal results are displayed) Labs Reviewed  URINALYSIS, ROUTINE W REFLEX MICROSCOPIC  GC/CHLAMYDIA PROBE AMP ([place]) NOT AT [facility]    EKG None  Radiology No  results found.  Procedures Procedures    Medications Ordered in ED Medications  cefTRIAXone (ROCEPHIN) injection 500 mg (has no administration in time range)  azithromycin (ZITHROMAX) tablet 1,000 mg (has no administration in time range)    ED Course/ Medical Decision Making/ A&P  Patient presenting with dysuria and discharge after having unprotected sex with a new partner.  Patient to be treated presumptively for gonorrhea/chlamydia pending cultures.  He will be given Rocephin and Zithromax and is to follow-up as needed.  Final Clinical Impression(s) / ED Diagnoses Final diagnoses:  None    Rx / DC Orders ED Discharge Orders     None         Veryl Speak, MD 04/13/22 [phone]

## 2022-04-14 LAB — GC/CHLAMYDIA PROBE AMP (~~LOC~~) NOT AT ARMC
Chlamydia: NEGATIVE
Comment: NEGATIVE
Comment: NORMAL
Neisseria Gonorrhea: POSITIVE — AB

## 2022-04-21 ENCOUNTER — Ambulatory Visit: Payer: Self-pay | Admitting: Cardiology

## 2022-04-25 ENCOUNTER — Encounter: Payer: Self-pay | Admitting: Cardiology

## 2023-03-26 ENCOUNTER — Emergency Department (HOSPITAL_COMMUNITY)
Admission: EM | Admit: 2023-03-26 | Discharge: 2023-03-27 | Disposition: A | Discharge status: Home / Self Care | Payer: Medicaid Other | Attending: Emergency Medicine | Admitting: Emergency Medicine

## 2023-03-26 ENCOUNTER — Other Ambulatory Visit: Payer: Self-pay

## 2023-03-26 ENCOUNTER — Encounter (HOSPITAL_COMMUNITY): Payer: Self-pay | Admitting: Emergency Medicine

## 2023-03-26 DIAGNOSIS — R5383 Other fatigue: Secondary | ICD-10-CM

## 2023-03-26 DIAGNOSIS — M791 Myalgia, unspecified site: Secondary | ICD-10-CM | POA: Insufficient documentation

## 2023-03-26 DIAGNOSIS — R051 Acute cough: Secondary | ICD-10-CM

## 2023-03-26 DIAGNOSIS — Z1152 Encounter for screening for COVID-19: Secondary | ICD-10-CM | POA: Insufficient documentation

## 2023-03-26 DIAGNOSIS — R509 Fever, unspecified: Secondary | ICD-10-CM | POA: Diagnosis not present

## 2023-03-26 DIAGNOSIS — R059 Cough, unspecified: Secondary | ICD-10-CM | POA: Diagnosis not present

## 2023-03-26 LAB — CBC WITH DIFFERENTIAL/PLATELET
Abs Immature Granulocytes: 0.02 10*3/uL (ref 0.00–0.07)
Basophils Absolute: 0 10*3/uL (ref 0.0–0.1)
Basophils Relative: 1 %
Eosinophils Absolute: 0.1 10*3/uL (ref 0.0–0.5)
Eosinophils Relative: 2 %
HCT: 42 % (ref 39.0–52.0)
Hemoglobin: 14.9 g/dL (ref 13.0–17.0)
Immature Granulocytes: 0 %
Lymphocytes Relative: 32 %
Lymphs Abs: 2 10*3/uL (ref 0.7–4.0)
MCH: 29.7 pg (ref 26.0–34.0)
MCHC: 35.5 g/dL (ref 30.0–36.0)
MCV: 83.7 fL (ref 80.0–100.0)
Monocytes Absolute: 0.7 10*3/uL (ref 0.1–1.0)
Monocytes Relative: 11 %
Neutro Abs: 3.3 10*3/uL (ref 1.7–7.7)
Neutrophils Relative %: 54 %
Platelets: 233 10*3/uL (ref 150–400)
RBC: 5.02 MIL/uL (ref 4.22–5.81)
RDW: 14.1 % (ref 11.5–15.5)
WBC: 6.1 10*3/uL (ref 4.0–10.5)
nRBC: 0 % (ref 0.0–0.2)

## 2023-03-26 LAB — BASIC METABOLIC PANEL
Anion gap: 10 (ref 5–15)
BUN: 13 mg/dL (ref 6–20)
CO2: 28 mmol/L (ref 22–32)
Calcium: 9 mg/dL (ref 8.9–10.3)
Chloride: 102 mmol/L (ref 98–111)
Creatinine, Ser: 1.21 mg/dL (ref 0.61–1.24)
GFR, Estimated: >60 mL/min (ref >60)
Glucose, Bld: 127 mg/dL — ABNORMAL HIGH (ref 70–99)
Potassium: 3.8 mmol/L (ref 3.5–5.1)
Sodium: 140 mmol/L (ref 135–145)

## 2023-03-26 LAB — RESP PANEL BY RT-PCR (RSV, FLU A&B, COVID)  RVPGX2
Influenza A by PCR: NEGATIVE
Influenza B by PCR: NEGATIVE
Resp Syncytial Virus by PCR: NEGATIVE
SARS Coronavirus 2 by RT PCR: NEGATIVE

## 2023-03-26 NOTE — ED Triage Notes (Signed)
Patient coming to ED for evaluation of fatigue x 2 days.  Reports possible fever.  Has had chills.  Having mild chest congestion.  No cough.

## 2023-03-26 NOTE — ED Provider Triage Note (Signed)
Emergency Medicine Provider Triage Evaluation Note  Lawrence Hammond , a 37 y.o. male  was evaluated in triage.  Pt complains of cough, congestion, subjective fevers and weakness for the past 2 days.  Denies chest pain, shortness of breath, syncope.   Review of Systems  Positive: As above Negative: As above  Physical Exam  BP 136/86 (BP Location: Right Arm)   Pulse 71   Temp 98.2 F (36.8 C) (Oral)   Resp 16   SpO2 99%  Gen:   Awake, no distress   Resp:  Normal effort  MSK:   Moves extremities without difficulty  Other:    Medical Decision Making  Medically screening exam initiated at 7:36 PM.  Appropriate orders placed.  Lawrence Hammond was informed that the remainder of the evaluation will be completed by another provider, this initial triage assessment does not replace that evaluation, and the importance of remaining in the ED until their evaluation is complete.     Lawrence Hammond, [place] 03/26/23 [phone]

## 2023-03-27 ENCOUNTER — Emergency Department (HOSPITAL_COMMUNITY): Payer: Medicaid Other

## 2023-03-27 DIAGNOSIS — R509 Fever, unspecified: Secondary | ICD-10-CM | POA: Diagnosis not present

## 2023-03-27 MED ORDER — ALBUTEROL SULFATE HFA 108 (90 BASE) MCG/ACT IN AERS
4.0000 | INHALATION_SPRAY | Freq: Once | RESPIRATORY_TRACT | Status: AC
  Administered 2023-03-27: 4 via RESPIRATORY_TRACT
  Filled 2023-03-27: qty 6.7

## 2023-03-27 NOTE — ED Provider Notes (Signed)
[place] EMERGENCY DEPARTMENT AT [facility] Provider Note   CSN: [id number] Arrival date & time: 03/26/23  1909     History  Chief Complaint  Patient presents with   Fatigue    Lawrence Hammond is a 37 y.o. male.  37 year old male who presents ER today with couple days of fatigue.  He states that he has had a productive cough during this time as well but just clear/white sputum.  Felt febrile and checked his temperature and states it was not very high but does not know what the number was.  No sick contacts he knows of.  Not eating as much is normal secondary to decreased appetite but drinking normal fluids, urinating normally, defecate normally, some mild bodyaches but no significant pain anywhere.  Denies alcohol, drugs or tobacco.        Home Medications Prior to Admission medications   Medication Sig Start Date End Date Taking? Authorizing Provider  diltiazem (CARDIZEM) 30 MG tablet Take 1 tablet (30 mg total) by mouth 2 (two) times daily. 11/21/21   Antoine Poche, MD  sucralfate (CARAFATE) 1 g tablet Take 1 tablet (1 g total) by mouth 4 (four) times daily as needed. Patient not taking: Reported on 09/02/2021 10/29/18   Sabas Sous, MD      Allergies    Patient has no known allergies.    Review of Systems   Review of Systems  Physical Exam Updated Vital Signs BP (!) 114/59 (BP Location: Right Arm)   Pulse (!) 59   Temp 97.9 F (36.6 C)   Resp 16   Ht 6\' 2"  (1.88 m)   Wt 81.6 kg   SpO2 100%   BMI 23.11 kg/m  Physical Exam Vitals and nursing note reviewed.  Constitutional:      Appearance: He is well-developed.  HENT:     Head: Normocephalic and atraumatic.  Cardiovascular:     Rate and Rhythm: Normal rate.  Pulmonary:     Effort: Pulmonary effort is normal. No respiratory distress.     Breath sounds: Decreased air movement present. No wheezing.     Comments: Has diminished breath sounds no obvious wheezing, no tachypnea Abdominal:      General: There is no distension.  Musculoskeletal:        General: Normal range of motion.     Cervical back: Normal range of motion.  Neurological:     Mental Status: He is alert.     ED Results / Procedures / Treatments   Labs (all labs ordered are listed, but only abnormal results are displayed) Labs Reviewed  BASIC METABOLIC PANEL - Abnormal; Notable for the following components:      Result Value   Glucose, Bld 127 (*)    All other components within normal limits  RESP PANEL BY RT-PCR (RSV, FLU A&B, COVID)  [id number]  CBC WITH DIFFERENTIAL/PLATELET    EKG None  Radiology No results found.  Procedures Procedures    Medications Ordered in ED Medications  albuterol (VENTOLIN HFA) 108 (90 Base) MCG/ACT inhaler 4 puff (4 puffs Inhalation Given 03/27/23 0134)    ED Course/ Medical Decision Making/ A&P                                 Medical Decision Making Amount and/or Complexity of Data Reviewed Radiology: ordered.  Risk Prescription drug management.  X-ray interpreted by myself without any obvious pneumonia,  pneumothorax or other obvious etiology for his symptoms.  COVID/flu is negative.  Suspect some type of viral illness possibly bronchitis.  Will try albuterol to see if that helps open up his lungs some and if so consider steroids.  No indication for antibiotics at this time.  Patient is vital signs are within normal limits have low suspicion for dehydration I do not think he needs any blood test at this point.  No indication for CT scan at this time either. No significant subjective improvement after albuterol.  Mild improvement in breath sounds but still no wheezing and low suspicion for bronchitis.  No indication for further imaging, workup or hospitalization.  No indication for antibiotics or steroids.  Radiology read is still not done at time of discharge however I do not see thing on the x-ray.  Will have to call the patient back if something is abnormal on  final read.  Work note provided.  Stable for discharge.   Final Clinical Impression(s) / ED Diagnoses Final diagnoses:  Other fatigue  Acute cough    Rx / DC Orders ED Discharge Orders     None         Konnor Vondrasek, Barbara Cower, MD 03/27/23 0206

## 2023-03-27 NOTE — ED Notes (Signed)
Pt provided with AVS.  Education complete; all questions answered.  Pt leaving ED in stable condition at this time, ambulatory with all belongings.
